# Patient Record
Sex: Female | Born: 1955 | Race: White | Hispanic: No | Marital: Single | Smoking: Former smoker
Health system: Southern US, Community
[De-identification: ages and names within clinical notes are randomized; demographics above are authoritative.]

## PROBLEM LIST (undated history)

## (undated) DIAGNOSIS — I10 Essential (primary) hypertension: Secondary | ICD-10-CM

## (undated) DIAGNOSIS — G709 Myoneural disorder, unspecified: Secondary | ICD-10-CM

## (undated) DIAGNOSIS — M1990 Unspecified osteoarthritis, unspecified site: Secondary | ICD-10-CM

## (undated) DIAGNOSIS — K219 Gastro-esophageal reflux disease without esophagitis: Secondary | ICD-10-CM

## (undated) HISTORY — DX: Unspecified osteoarthritis, unspecified site: M19.90

## (undated) HISTORY — PX: OOPHORECTOMY: SHX86

## (undated) HISTORY — PX: CHOLECYSTECTOMY: SHX55

## (undated) HISTORY — PX: KNEE ARTHROSCOPY: SUR90

## (undated) HISTORY — PX: TONSILLECTOMY AND ADENOIDECTOMY: SUR1326

## (undated) HISTORY — DX: Myoneural disorder, unspecified: G70.9

## (undated) HISTORY — DX: Gastro-esophageal reflux disease without esophagitis: K21.9

## (undated) HISTORY — DX: Essential (primary) hypertension: I10

## (undated) HISTORY — PX: ANKLE ARTHROSCOPY: SUR85

## (undated) HISTORY — PX: DILATION AND CURETTAGE OF UTERUS: SHX78

---

## 1998-08-25 ENCOUNTER — Emergency Department (HOSPITAL_COMMUNITY): Admission: EM | Admit: 1998-08-25 | Discharge: 1998-08-26 | Payer: Self-pay | Admitting: Emergency Medicine

## 1998-08-26 ENCOUNTER — Ambulatory Visit (HOSPITAL_COMMUNITY): Admission: RE | Admit: 1998-08-26 | Discharge: 1998-08-26 | Payer: Self-pay | Admitting: Emergency Medicine

## 1998-08-26 ENCOUNTER — Encounter: Payer: Self-pay | Admitting: Emergency Medicine

## 1998-10-19 ENCOUNTER — Encounter: Payer: Self-pay | Admitting: Gastroenterology

## 1998-10-19 ENCOUNTER — Ambulatory Visit (HOSPITAL_COMMUNITY): Admission: RE | Admit: 1998-10-19 | Discharge: 1998-10-19 | Payer: Self-pay | Admitting: Gastroenterology

## 1998-10-28 ENCOUNTER — Ambulatory Visit (HOSPITAL_COMMUNITY): Admission: RE | Admit: 1998-10-28 | Discharge: 1998-10-28 | Payer: Self-pay | Admitting: Gastroenterology

## 2000-03-03 ENCOUNTER — Other Ambulatory Visit: Admission: RE | Admit: 2000-03-03 | Discharge: 2000-03-03 | Payer: Self-pay | Admitting: Obstetrics and Gynecology

## 2000-03-27 ENCOUNTER — Ambulatory Visit (HOSPITAL_BASED_OUTPATIENT_CLINIC_OR_DEPARTMENT_OTHER): Admission: RE | Admit: 2000-03-27 | Discharge: 2000-03-27 | Payer: Self-pay | Admitting: Orthopedic Surgery

## 2001-02-13 ENCOUNTER — Other Ambulatory Visit: Admission: RE | Admit: 2001-02-13 | Discharge: 2001-02-13 | Payer: Self-pay | Admitting: Obstetrics and Gynecology

## 2002-02-20 ENCOUNTER — Ambulatory Visit (HOSPITAL_BASED_OUTPATIENT_CLINIC_OR_DEPARTMENT_OTHER): Admission: RE | Admit: 2002-02-20 | Discharge: 2002-02-20 | Payer: Self-pay | Admitting: Orthopedic Surgery

## 2002-12-10 ENCOUNTER — Other Ambulatory Visit: Admission: RE | Admit: 2002-12-10 | Discharge: 2002-12-10 | Payer: Self-pay | Admitting: Obstetrics and Gynecology

## 2003-02-27 ENCOUNTER — Encounter (INDEPENDENT_AMBULATORY_CARE_PROVIDER_SITE_OTHER): Payer: Self-pay | Admitting: Specialist

## 2003-02-27 ENCOUNTER — Ambulatory Visit (HOSPITAL_COMMUNITY): Admission: RE | Admit: 2003-02-27 | Discharge: 2003-02-27 | Payer: Self-pay | Admitting: Obstetrics and Gynecology

## 2003-10-02 ENCOUNTER — Emergency Department (HOSPITAL_COMMUNITY): Admission: EM | Admit: 2003-10-02 | Discharge: 2003-10-03 | Payer: Self-pay | Admitting: *Deleted

## 2003-10-28 ENCOUNTER — Encounter: Admission: RE | Admit: 2003-10-28 | Discharge: 2003-10-28 | Payer: Self-pay | Admitting: Gastroenterology

## 2003-12-08 ENCOUNTER — Ambulatory Visit (HOSPITAL_COMMUNITY): Admission: RE | Admit: 2003-12-08 | Discharge: 2003-12-08 | Payer: Self-pay | Admitting: Gastroenterology

## 2003-12-09 ENCOUNTER — Ambulatory Visit (HOSPITAL_COMMUNITY): Admission: RE | Admit: 2003-12-09 | Discharge: 2003-12-09 | Payer: Self-pay | Admitting: Surgery

## 2003-12-09 ENCOUNTER — Encounter (INDEPENDENT_AMBULATORY_CARE_PROVIDER_SITE_OTHER): Payer: Self-pay | Admitting: Specialist

## 2005-06-14 ENCOUNTER — Encounter: Admission: RE | Admit: 2005-06-14 | Discharge: 2005-09-12 | Payer: Self-pay | Admitting: Internal Medicine

## 2007-01-22 ENCOUNTER — Ambulatory Visit (HOSPITAL_COMMUNITY): Admission: RE | Admit: 2007-01-22 | Discharge: 2007-01-22 | Payer: Self-pay | Admitting: Gastroenterology

## 2007-12-30 ENCOUNTER — Encounter: Admission: RE | Admit: 2007-12-30 | Discharge: 2007-12-30 | Payer: Self-pay | Admitting: Obstetrics and Gynecology

## 2008-04-02 ENCOUNTER — Encounter (INDEPENDENT_AMBULATORY_CARE_PROVIDER_SITE_OTHER): Payer: Self-pay | Admitting: Obstetrics and Gynecology

## 2008-04-02 ENCOUNTER — Ambulatory Visit (HOSPITAL_COMMUNITY): Admission: RE | Admit: 2008-04-02 | Discharge: 2008-04-02 | Payer: Self-pay | Admitting: Obstetrics and Gynecology

## 2009-04-18 ENCOUNTER — Emergency Department (HOSPITAL_COMMUNITY): Admission: EM | Admit: 2009-04-18 | Discharge: 2009-04-18 | Payer: Self-pay | Admitting: Emergency Medicine

## 2010-05-31 ENCOUNTER — Encounter: Payer: Self-pay | Admitting: Obstetrics and Gynecology

## 2010-09-21 NOTE — Op Note (Signed)
NAME:  Susan Morrow, Susan Morrow              ACCOUNT NO.:  [id number]   MEDICAL RECORD NO.:  [id number]          PATIENT TYPE:  AMB   LOCATION:  SDC                           FACILITY:  [facility]   PHYSICIAN:  Sherry A. Dickstein, M.D.DATE OF BIRTH:  12-23-1955   DATE OF PROCEDURE:  04/02/2008  DATE OF DISCHARGE:                               OPERATIVE REPORT   PREOPERATIVE DIAGNOSIS:  Left adnexal mass.   POSTOPERATIVE DIAGNOSIS:  Left adnexal mass.   PROCEDURES:  Diagnostic laparoscopy and left salpingo-oophorectomy.   SURGEON:  Sherry A. Rosalio Macadamia, MD   ASSISTANT:  Lendon Colonel, MD   ANESTHESIA:  General.   INDICATIONS:  This is a 55 year old G0, P0 woman, who has had a left  adnexal mass diagnosed by ultrasound.  This has been followed  conservatively with repeat ultrasound; however, it has increased in  size.  The patient recently has noticed some increasing left lower  quadrant pain.  Because of the increased size on ultrasound, the patient  was brought to the operating room for diagnostic laparoscopy and  possible left salpingo-oophorectomy.   FINDINGS:  Normal-sized anteflexed uterus; normal right fallopian tube  and ovary; left adnexal mass consistent with fibroma; left ovary not  seen separate from the mass; left fallopian tube; and fimbria only  visualized, this involving the mass as well.   PROCEDURE IN DETAIL:  The patient was brought into the operating room.  She was given adequate general anesthesia.  She was placed in dorsal  lithotomy position.  Her abdomen and then her vagina were washed with  Betadine.  Bladder was catheterized with a Foley catheter.  A speculum  was placed within the vagina.  A single-tooth tenaculum was placed on  the cervix.  Cervix was attempted to be dilated.  However, it was  completely stenotic.  Small dilators were attempted to be passed in the  endocervical canal.  However, it was so stenotic even these dilators  could not be passed.   Therefore, a second single-tooth tenaculum was  placed on the cervix for manipulation.  Surgeon's gown and gloves were  changed, and the patient was draped in the sterile fashion.  Subumbilical incision was made after infiltrating with 0.25% Marcaine.  The incision was brought down to the fascia.  Fascia was grasped with  Kocher clamps.  Fascia was incised.  Fascial edges were identified and a  purse-string stitch was taken with 0 Vicryl.  The peritoneum was  identified and bluntly entered.  The Hasson trocar was introduced into  the peritoneal cavity and under direct visualization, the laparoscope  was introduced.  Carbon dioxide was insufflated.  The pelvic organs were  identified.  A left incision was made.  After infiltrating with 0.25%  Marcaine, 5-mm incision was made and a 5-mm trocar was introduced under  direct visualization.  Same procedure was performed on the right.  Pelvis was inspected, peritoneal washings were obtained with Ringer  lactate, and these were sent to Pathology.  The adnexal mass was  identified and felt to be incorporated into the fallopian tube and  ovary.  Adhesions of  the sigmoid colon were taken down off of the left  side wall to better see the adnexa.  The left ureter was identified well  below the IP ligament.  After long visualization, there is a small  amount of endometriosis present in the left side wall.  After the  adhesions were dissected and the bowel was cleared from the left tube  and ovary, the infundibulopelvic ligament was cauterized using Gyrus.  This was cauterized in several places and then cut and then along the  mesosalpinx, this was cauterized up to the utero-ovarian ligament.  The  specimen was then dissected free from the uterus in this fashion.  A 5-  mm scope was placed in one of the ports.  The main scope had been  removed and an Endocatch bag was placed.  The specimen was placed within  the Endocatch bag.  This was removed through  the subumbilical incision.  The main scope was then replaced.  The pelvis was inspected.  Irrigation  was used.  Adequate hemostasis was present after small amount of  cautery.  Pictures were obtained before and after surgery.  Marcaine 20  mL of 0.25% were left in the pelvis for postoperative pain relief.  All  carbon dioxide was allowed to escape.  The ports had been removed  laterally prior to the carbon dioxide escaping.  The subumbilical trocar  sleeve was removed.  The fascial stitch was closed over a finger to  preclude any bowel getting caught.  The incisions were all closed with 4-  0 Monocryl in subcuticular running stitch.  All incisions were also  closed with Dermabond.  Adequate hemostasis was present.  The tenaculums  were removed from the vagina.  The patient was taken out of the dorsal  lithotomy position.  She was awakened.  She was moved from the operating  table to a stretcher in stable condition.   COMPLICATIONS:  None.   ESTIMATED BLOOD LOSS:  Less than 10 mL.   SPECIMEN:  Left tube and ovary which was sent to Pathology and pelvic  washings also sent to pathology.      Sherry A. Rosalio Macadamia, M.D.  Electronically Signed     SAD/MEDQ  D:  04/02/2008  T:  04/02/2008  Job:  [id number]

## 2010-09-24 NOTE — Op Note (Signed)
NAME:  Susan Morrow, SMELCER                        ACCOUNT NO.:  [id number]   MEDICAL RECORD NO.:  [id number]                   PATIENT TYPE:  AMB   LOCATION:  SDC                                  [facility]ACILITY:  [facility]   PHYSICIAN:  Sherry A. Rosalio Macadamia, M.D.           DATE OF BIRTH:  Mar 29, 1956   DATE OF PROCEDURE:  02/27/2003  DATE OF DISCHARGE:                                 OPERATIVE REPORT   PREOPERATIVE DIAGNOSES:  1. Menorrhagia.  2. Thickened endometrium.  3. Hematometra.   POSTOPERATIVE DIAGNOSES:  1. Menorrhagia.  2. Thickened endometrium.  3. Hematometra.  4. Endocervical polyps.   PROCEDURE:  D&C hysteroscopy with resectoscope.   SURGEON:  Sherry A. Rosalio Macadamia, M.D.   ANESTHESIA:  MAC.   INDICATIONS FOR PROCEDURE:  This is a 54 year old G0, P0 woman who has been  having excessively heavy menstrual flow with her monthly periods.  The  patient had an ultrasound which revealed several large Nabothian cysts as  well as thickened endometrium and fluid in mid cavity of the endometrium.  Because of this, the patient is brought to the operating room for [facility]  hysteroscopy with resectoscope.   FINDINGS:  Normal size anteflexed uterus, no adnexal mass.  Cervical  stenosis.  Small hematometra, endocervical polyps.   DESCRIPTION OF PROCEDURE:  The patient is brought into the operating room  and given adequate IV sedation.  She is placed in dorsal lithotomy position.  Her perineum was washed with Hibiclens.  Pelvic examination was performed.  The patient was draped in sterile fashion.  Speculum was placed in the  vagina.  Vagina is washed with Hibiclens.  Paracervical block was  administered with 1% Nesacaine.  Anterior lip of the cervix is grasped with  a single-tooth tenaculum.  Cervical Nabothian cyst was drained.  Cervix was  sounded with some difficulty because of significant cervical stenosis.  After sounding the cervix, a small amount of old fluid drained from the  cervix.   The cervix was then dilated with Shawnie Pons dilators to a #31.  Hysteroscope was introduced into the endometrial cavity.  Pictures were  obtained.  An endocervical polyp was present.  This was excised with a  double loop resector.  Another polyp was seen at the endocervical lower  uterine segment junction and this was excised with the double loop resector.  Sheets of endometrial tissue were taken circumferentially which allowed good  viewing of the whole endometrial cavity.  Small bleeders were cauterized.  The endometrial tissue was cauterized circumferentially.  Adequate  hemostasis was present.  All instruments removed from the vagina.  The  patient was taken out of the dorsal lithotomy position.  She was awakened.  She was moved from the operating table to a stretcher in stable condition.   COMPLICATIONS:  None.   ESTIMATED BLOOD LOSS:  Less than 5 mL.   FLUIDS:  Sorbitol differential was -50 mL.   SPECIMENS:  One  endocervical polyp and two endometrial resections.                                               Sherry A. Rosalio Macadamia, M.D.    SAD/MEDQ  D:  02/27/2003  T:  02/27/2003  Job:  [id number]

## 2010-09-24 NOTE — Op Note (Signed)
[facility]  Patient:    Susan Morrow, Susan Morrow                       MRN: [id number] Proc. Date: 03/24/00 Adm. Date:  [id number] Disc. Date: [id number] Attending:  Alinda Deem                           Operative Report  PREOPERATIVE DIAGNOSIS:  Right ankle osteochondral defect.  POSTOPERATIVE DIAGNOSIS:  Right ankle osteochondral defect.  OPERATION PERFORMED:  Arthroscopic debridement of osteochondral defect of the talus of the right ankle.  SURGEON:  Alinda Deem, M.D.  ASSISTANT:  Skip Mayer, PA-C.  ANESTHESIA:  General endotracheal.  ESTIMATED BLOOD LOSS:  Minimal.  FLUID REPLACEMENT:  900 cc of crystalloid.  DRAINS:  None.  TOURNIQUET TIME:  None.  INDICATIONS FOR PROCEDURE:  The patient is a 55 year old woman with MRI proven osteochondral defect of the lateral side of the talar dome symptomatic with catching and pain and desires elective arthroscopic removal of same.  She has failed conservative treatment.  DESCRIPTION OF PROCEDURE:  The patient was identified by arm band and taken to the operating room at [facility] where the appropriate anesthetic monitors were attached and general endotracheal anesthesia induced with the patient in the supine position.  The right lower extremity was then prepped and draped in the usual sterile fashion from the toes to the midcalf where a tourniquet was placed but not used.  Traction was applied to the ankle and using the anterolateral portal, we injected 10 cc of 0.5% Marcaine with epinephrine solution into the ankle and then following this needle pathway, a standard portal was made with a #11 blade allowing introduction of the arthroscope through the anterolateral portal.  Diagnostic arthroscopy was then undertaken revealing normal anatomy except for an osteochondral flap tear of the lateral aspect of the talar dome just slightly anterior more than superior.  It was 3 to  4 mm in length and 2 to 3 mm in width.  This was then removed and a 2.9 mm great white sucker shaver from [employer] and the edges probed to make sure there was no stable margins.  We then took a small trephine and drilled holes into the subchondral bone to bring in a vascularity blood supply and some fibrocartilage.  At this point the ankle was then washed out with normal saline solution.  The arthroscopic instruments removed.  A dressing of Xeroform, 4 x 8 dressing sponges, Webril and an Ace wrap applied. The patient was awakened and taken to the recovery room without difficulty. DD:  04/10/00 TD:  04/10/00 Job: [id number]

## 2010-09-24 NOTE — Op Note (Signed)
NAME:  Susan Morrow, Susan Morrow                        ACCOUNT NO.:  [id number]   MEDICAL RECORD NO.:  [id number]                   PATIENT TYPE:  AMB   LOCATION:  ENDO                                 FACILITY:  [facility]   PHYSICIAN:  Abigail Miyamoto, M.D.              DATE OF BIRTH:  [birth year]   DATE OF PROCEDURE:  12/09/2003  DATE OF DISCHARGE:                                 OPERATIVE REPORT   PREOPERATIVE DIAGNOSIS:  Biliary dyskinesia.   POSTOPERATIVE DIAGNOSIS:  Biliary dyskinesia.   OPERATION PERFORMED:  Laparoscopic cholecystectomy with intraoperative  cholangiogram.   SURGEON:  Douglas A. Magnus Ivan, M.D.   ASSISTANT:  Rose Phi. Maple Hudson, M.D.   ANESTHESIA:  General endotracheal.   ESTIMATED BLOOD LOSS:  Minimal.   OPERATIVE FINDINGS:  The patient was found to have a normal cholangiogram.   DESCRIPTION OF PROCEDURE:  The patient was brought to the operating room and  identified at [employer].  She was placed supine on the operating table  and general anesthesia was induced.  Her abdomen was then prepped and draped  in the usual sterile fashion.  Using a #15 blade, a small vertical incision  was made below the umbilicus.  The incision was carried down to the fascia  which was then opened with a scalpel.  A hemostat was then used to pass into  the peritoneal cavity.  A 0 Vicryl pursestring suture was then placed around  the fascial opening.  The Hasson port was placed through the opening and  insufflation of the abdomen was begun.  A 12 mm port was then placed in the  patient's epigastrium and two 5 mm ports were placed in the patient's right  flank under direction vision.  The gallbladder was then grasped and  retracted above the liver bed.  The gallbladder appeared chronically thick-  walled.  The cystic duct and cystic artery were then easily dissected out.  A critical window was achieved around the cystic duct.  The artery was  clipped twice proximally and once distally.   The duct was then clipped once  distally and opened with laparoscopic scissors.  The Angiocatheter then  inserted into the right upper quadrant under direct vision.  The  cholangiocatheter was passed through this and placed into the opening in the  cystic duct.  The cholangiogram was then performed with contrast under  direct fluoroscopy.  Contrast was seen to flow into the entire biliary  system and duodenum without evidence of abnormality or obstruction.  The  cholangiocath was then removed completely.  The cystic duct was then clipped  three times proximally and transected.  The cystic artery was transected as  well.  The gallbladder was then slowly dissected free from the liver bed  with electrocautery.  Hemostasis was achieved in the liver bed with the  cautery.  The gallbladder was then removed through the incision at the  umbilicus.  The 0 Vicryl  was then tied in placed closing the fascial defect.  The abdomen was then again examined.  Hemostasis was felt to be achieved.  All ports were removed under direct vision and the abdomen was deflated.  All incisions were then anesthetized with 0.25% Marcaine and closed with 4-0  Vicryl subcuticular.  Steri-Strips, gauze and tape were then applied.  The  patient tolerated the procedure well.  All sponge, needle and instrument  counts were correct at the end of the procedure.  The patient was then  extubated in the operating room and taken in stable condition to the  recovery room.                                               Abigail Miyamoto, M.D.    DB/MEDQ  D:  12/09/2003  T:  12/09/2003  Job:  [id number]

## 2010-09-24 NOTE — Op Note (Signed)
NAME:  Susan Morrow, Susan Morrow                        ACCOUNT NO.:  [id number]   MEDICAL RECORD NO.:  [id number]                   PATIENT TYPE:  AMB   LOCATION:  ENDO                                 FACILITY:  [facility]   PHYSICIAN:  Anselmo Rod, M.D.               DATE OF BIRTH:  11-18-1955   DATE OF PROCEDURE:  12/08/2003  DATE OF DISCHARGE:                                 OPERATIVE REPORT   PROCEDURE:  Screening colonoscopy.   ENDOSCOPIST:  Anselmo Rod, M.D.   INSTRUMENT:  Olympus video colonoscope.   INDICATIONS FOR PROCEDURE:  A 55 year old white female with family history  of colon cancer undergoing a screening colonoscopy to rule out colonic  polyps, masses, etc.  The patient has had a change in bowel habits with  worsening diarrhea in the recent past.   PRE-PROCEDURE PREPARATION:  Informed consent was procured from the patient.  Patient fasted for 8 hours prior to the procedure and prepped with a bottle  of magnesium citrate and a gallon of GoLYTELY the night prior to the  procedure.  Pre-procedure physical:  Patient had stable vital signs, neck  supple, chest clear to auscultation, S1/S2 regular, abdomen soft with normal  bowel sounds.   DESCRIPTION OF PROCEDURE:  The patient was placed in the left lateral  decubitus position, sedated with 70 mg of Demerol and 7 mg of Versed  intravenously.  Once the patient was adequately sedated and maintained on  low flow oxygen, continuous cardiac monitoring; the Olympus video  colonoscope was advanced from the rectum to the cecum.  The appendiceal  orifice and the ileocecal valve were clearly visualized and photographed.  The terminal ileum appeared normal, no masses, polyps, erosions or  ulcerations were seen. Retroflexion in the rectum revealed no abnormalities.   IMPRESSION:  Normal colonoscopy to the cecum.   RECOMMENDATIONS:  1. Continue a high fiber diet with liberal fluids intake.  2. Repeat colonoscopy has been recommended  in the next 5 years unless the     patient develops any abnormal symptoms in the interim.  3. Outpatient follow up in the next 2 weeks or earlier if need be.                                               Anselmo Rod, M.D.    JNM/MEDQ  D:  12/08/2003  T:  12/08/2003  Job:  [id number]   cc:   Gabriel Earing, M.D.  [address]  [place]  [place] [address]  Fax: [phone]

## 2010-09-24 NOTE — Op Note (Signed)
NAME:  Susan Morrow, PALL                          ACCOUNT NO.:  [id number]   MEDICAL RECORD NO.:  [id number]                   PATIENT TYPE:  AMB   LOCATION:  DSC                                  FACILITY:  [facility]   PHYSICIAN:  Feliberto Gottron. Turner Daniels, M.D.                DATE OF BIRTH:  10/15/1955   DATE OF PROCEDURE:  02/20/2002  DATE OF DISCHARGE:                                 OPERATIVE REPORT   PREOPERATIVE DIAGNOSES:  1. Right knee chondromalacia of patella.  2. Medial meniscal tear.   POSTOPERATIVE DIAGNOSES:  1. Right chondromalacia of patella.  2. Medal meniscal tear.  3. Chondromalacia, lateral tibial condyle.   OPERATION PERFORMED:  1. Right knee partial medial meniscectomy.  2. Debridement of chondromalacia of patella, Grade III, focal apex, lateral     tibial condyle, Grade III, more regional around the rim of the meniscus.   SURGEON:  Feliberto Gottron. Turner Daniels, M.D.   FIRST ASSISTANT:  Erskine Squibb B. Su Hilt, P. A.   ANESTHESIA:  Local with IV sedation.   ESTIMATED BLOOD LOSS:  Minimal.   FLUIDS REPLACED:  Eight-hundred cc of crystalloid.   DRAINS PLACED:  None.   TOURNIQUET TIME:  None.   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  She has failed conservative treatment and now  desires arthroscopic evaluation and treatment of her right  knee.   DESCRIPTION OF OPERATION:  The patient was identified by armband and brought  to the operating room at [facility].  Appropriate anesthetic  monitors were attached and local anesthesia with IV sedation was induced  into the right lower extremity.  Lateral post applied to the table.  The  right lower extremity was prepped and draped in the usual sterile fashion  from the ankle to the midthigh.  Using a #11 blade standard inferomedial and  inferolateral parapatellar portals were made allowing  introduction of the  arthroscope through the inferolateral portal, and the outflow through the  inferomedial portal.  We immediately encountered multiple small  cartilaginous loose bodies in the joint fluid, which were taken through the  outflow continuously.  Apical chondromalacia of the patella was identified,  Grade III, and debrided back to stable margin with a 3.5 gator sucker  shaver.  The trochlea was in relatively good condition.  A small radial tear  at the medial meniscus was identified and debrided.  Grade II chondromalacia  of the medial tibial condyle was identified and widely debrided.  The  cruciate ligaments were intact.  On the lateral side regional  chondromalacia,  Grade III, to the lateral third of the lateral tibial  condyle was identified and debrided with flap tears; and, this was probably  the donor site for the chondral loose bodies.  The meniscus was intact  laterally.  The gutters were clear.  The scope was taken medial to the PCL  and a glimpse of the posterior horn of the meniscus was noted to be intact.   At this point the knee was washed out with normal saline solution.  The  arthroscopic instruments removed.  A dressing of Xeroform, four-by-four  dressings, sponges, Webril, and Ace wrap applied.   The patient was awakened and taken to the recovery room without difficulty.                                                 Feliberto Gottron. Turner Daniels, M.D.    Ovid Curd  D:  02/20/2002  T:  02/20/2002  Job:  [id number]

## 2011-02-08 LAB — COMPREHENSIVE METABOLIC PANEL
ALT: 33
AST: 23
Albumin: 3.7
Alkaline Phosphatase: 80
BUN: 18
GFR calc Af Amer: >60
Potassium: 3.7
Sodium: 140
Total Protein: 6.7

## 2011-02-08 LAB — DIFFERENTIAL
Basophils Relative: 1
Eosinophils Relative: 4
Monocytes Absolute: 0.8
Monocytes Relative: 9
Neutro Abs: 5.4

## 2011-02-08 LAB — CBC
Platelets: 242
RDW: 12.9
WBC: 8.9

## 2011-02-08 LAB — GLUCOSE, CAPILLARY

## 2011-06-02 ENCOUNTER — Encounter (INDEPENDENT_AMBULATORY_CARE_PROVIDER_SITE_OTHER): Payer: Self-pay | Admitting: Surgery

## 2011-06-08 ENCOUNTER — Encounter (INDEPENDENT_AMBULATORY_CARE_PROVIDER_SITE_OTHER): Payer: Self-pay | Admitting: Surgery

## 2011-06-13 ENCOUNTER — Encounter (INDEPENDENT_AMBULATORY_CARE_PROVIDER_SITE_OTHER): Payer: Self-pay | Admitting: Surgery

## 2011-07-04 ENCOUNTER — Ambulatory Visit (INDEPENDENT_AMBULATORY_CARE_PROVIDER_SITE_OTHER): Payer: BC Managed Care – PPO | Admitting: Surgery

## 2011-07-04 ENCOUNTER — Encounter (INDEPENDENT_AMBULATORY_CARE_PROVIDER_SITE_OTHER): Payer: Self-pay | Admitting: Surgery

## 2011-07-04 VITALS — BP 154/106 | HR 80 | Temp 97.4°F | Resp 18 | Ht 68.0 in | Wt 233.6 lb

## 2011-07-04 DIAGNOSIS — D1779 Benign lipomatous neoplasm of other sites: Secondary | ICD-10-CM

## 2011-07-04 DIAGNOSIS — D171 Benign lipomatous neoplasm of skin and subcutaneous tissue of trunk: Secondary | ICD-10-CM | POA: Insufficient documentation

## 2011-07-04 NOTE — Progress Notes (Signed)
Subjective:     Patient ID: Susan Morrow, female   DOB: 1956/01/08, 56 y.o.   MRN: [id number]  HPI This is a pleasant female referred by Georgette Shell P.A. for an evaluation of a lipoma on her back. She reports it has been there for many years. It is now slightly larger and is causing some intermittent discomfort. She is otherwise without complaints.  Review of Systems     Objective:   Physical Exam On exam, there is a 1 cm soft mass on the left mid lateral back. It is mobile and there are no skin changes    Assessment:     Lipoma of the back    Plan:     I discussed the benign nature of disease. I discussed surgical excision versus expectant management. She is going to think about it and in call back should she determine whether she will surgery or not

## 2011-07-14 ENCOUNTER — Ambulatory Visit
Admission: RE | Admit: 2011-07-14 | Discharge: 2011-07-14 | Disposition: A | Discharge status: Home / Self Care | Payer: BC Managed Care – PPO | Source: Ambulatory Visit | Attending: Otolaryngology | Admitting: Otolaryngology

## 2011-07-14 ENCOUNTER — Other Ambulatory Visit: Payer: Self-pay | Admitting: Otolaryngology

## 2011-07-14 DIAGNOSIS — IMO0001 Reserved for inherently not codable concepts without codable children: Secondary | ICD-10-CM

## 2011-07-14 DIAGNOSIS — R519 Headache, unspecified: Secondary | ICD-10-CM

## 2011-11-21 DIAGNOSIS — I10 Essential (primary) hypertension: Secondary | ICD-10-CM | POA: Insufficient documentation

## 2011-11-21 DIAGNOSIS — E119 Type 2 diabetes mellitus without complications: Secondary | ICD-10-CM | POA: Insufficient documentation

## 2011-11-25 ENCOUNTER — Encounter (INDEPENDENT_AMBULATORY_CARE_PROVIDER_SITE_OTHER): Payer: Self-pay

## 2012-03-23 ENCOUNTER — Emergency Department (INDEPENDENT_AMBULATORY_CARE_PROVIDER_SITE_OTHER): Payer: Worker's Compensation

## 2012-03-23 ENCOUNTER — Emergency Department (INDEPENDENT_AMBULATORY_CARE_PROVIDER_SITE_OTHER)
Admission: EM | Admit: 2012-03-23 | Discharge: 2012-03-23 | Disposition: A | Discharge status: Home / Self Care | Payer: Worker's Compensation | Source: Home / Self Care | Attending: Emergency Medicine | Admitting: Emergency Medicine

## 2012-03-23 ENCOUNTER — Encounter (HOSPITAL_COMMUNITY): Payer: Self-pay | Admitting: Emergency Medicine

## 2012-03-23 DIAGNOSIS — S6390XA Sprain of unspecified part of unspecified wrist and hand, initial encounter: Secondary | ICD-10-CM

## 2012-03-23 DIAGNOSIS — S139XXA Sprain of joints and ligaments of unspecified parts of neck, initial encounter: Secondary | ICD-10-CM

## 2012-03-23 DIAGNOSIS — S39012A Strain of muscle, fascia and tendon of lower back, initial encounter: Secondary | ICD-10-CM

## 2012-03-23 DIAGNOSIS — S335XXA Sprain of ligaments of lumbar spine, initial encounter: Secondary | ICD-10-CM

## 2012-03-23 DIAGNOSIS — S161XXA Strain of muscle, fascia and tendon at neck level, initial encounter: Secondary | ICD-10-CM

## 2012-03-23 DIAGNOSIS — S63619A Unspecified sprain of unspecified finger, initial encounter: Secondary | ICD-10-CM

## 2012-03-23 MED ORDER — CYCLOBENZAPRINE HCL 10 MG PO TABS: ORAL_TABLET | ORAL | Status: DC

## 2012-03-23 MED ORDER — IBUPROFEN 800 MG PO TABS
ORAL_TABLET | ORAL | Status: AC
  Filled 2012-03-23: qty 1

## 2012-03-23 MED ORDER — IBUPROFEN 800 MG PO TABS
800.0000 mg | ORAL_TABLET | Freq: Once | ORAL | Status: AC
  Administered 2012-03-23: 800 mg via ORAL
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
## 2012-03-23 NOTE — ED Provider Notes (Signed)
Medical screening examination/treatment/procedure(s) were performed by non-physician practitioner and as supervising physician I was immediately available for consultation/collaboration.  Leslee Home, M.D.   Reuben Likes, MD 03/23/12 2125
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
## 2012-05-10 ENCOUNTER — Other Ambulatory Visit: Payer: Self-pay

## 2013-07-06 ENCOUNTER — Encounter (HOSPITAL_COMMUNITY): Payer: Self-pay | Admitting: Emergency Medicine

## 2013-07-06 ENCOUNTER — Emergency Department (INDEPENDENT_AMBULATORY_CARE_PROVIDER_SITE_OTHER)
Admission: EM | Admit: 2013-07-06 | Discharge: 2013-07-06 | Disposition: A | Discharge status: Home / Self Care | Payer: Worker's Compensation | Source: Home / Self Care | Attending: Emergency Medicine | Admitting: Emergency Medicine

## 2013-07-06 ENCOUNTER — Emergency Department (INDEPENDENT_AMBULATORY_CARE_PROVIDER_SITE_OTHER): Payer: Worker's Compensation

## 2013-07-06 DIAGNOSIS — T148XXA Other injury of unspecified body region, initial encounter: Secondary | ICD-10-CM

## 2013-07-06 NOTE — ED Provider Notes (Signed)
Medical screening examination/treatment/procedure(s) were performed by a resident physician and as supervising physician I was immediately available for consultation/collaboration.  Philipp Deputy, M.D.  Harden Mo, MD 07/06/13 2026

## 2013-07-06 NOTE — Discharge Instructions (Signed)
You likely bruised one of you knee bones when you fell.  There is no sign of a fracture.  Keep it elevated as much as possible. Apply ice for 20 minutes 3 times a day. Continue taking meloxicam. Use Voltaren gel as needed. You can take tylenol arthritis if needed.  Follow up with your primary doctor or orthopedist if it is not improving in the next 2 weeks.

## 2013-07-06 NOTE — ED Provider Notes (Signed)
CSN: [id number]     Arrival date & time 07/06/13  1504 History   First MD Initiated Contact with Patient 07/06/13 1629     Chief Complaint  Patient presents with  . Knee Pain   (Consider location/radiation/quality/duration/timing/severity/associated sxs/prior Treatment) HPI Patient reports falling on her left knee on Wednesday.  Reports tripping over an electrical cord at her work.  She landed with most of her weight on her bent left knee (carpet over concrete).  States she had immediate pain and swelling of the knee.  She was able to bear weight within a few minutes but with pain.  Since the fall, she reports getting a little better.  Still reports swelling in the knee, although it is improved.  At rest she doesn't have much pain, but she is very sore over the insertion of the patellar tendon.  Pain is worse when she walks, especially up and down stairs.  Denies any twisting component of the injury.  Does have some popping in the knee, but no locking.  Denies worsening pain with rotational movements.  She is taking daily meloxicam for other MSK injuries.  She has also been using some voltaren gel on the knee, which seems to help some.  Past Medical History  Diagnosis Date  . Diabetes mellitus   . Arthritis   . Asthma   . GERD (gastroesophageal reflux disease)   . Hypertension   . Neuromuscular disorder     neuropathy, and tremor   Past Surgical History  Procedure Laterality Date  . Cholecystectomy    . Dilation and curettage of uterus    . Oophorectomy      left  . Tonsillectomy and adenoidectomy    . Knee arthroscopy    . Ankle arthroscopy      right   Family History  Problem Relation Age of Onset  . Cancer Mother     kidney, brain  . Cancer Father     lung, pancreatic   History  Substance Use Topics  . Smoking status: Former Research scientist (life sciences)  . Smokeless tobacco: Not on file  . Alcohol Use: Yes     Comment: occ.   OB History   Grav Para Term Preterm Abortions TAB SAB Ect Mult  Living                 Review of Systems  Constitutional: Negative.   Respiratory: Negative.   Cardiovascular: Negative.   Gastrointestinal: Negative.   Genitourinary: Negative.   Musculoskeletal: Positive for arthralgias (left knee pain with swelling).  Neurological: Negative.     Allergies  Spironolactone; Promethazine hcl; Lortab; Phenergan; Shrimp; Vigamox; and Hydrocodone-acetaminophen  Home Medications   Current Outpatient Rx  Name  Route  Sig  Dispense  Refill  . cetirizine (ZYRTEC) 10 MG tablet   Oral   Take 10 mg by mouth daily.         . cyclobenzaprine (FLEXERIL) 10 MG tablet      1/2 to one tab po TID   20 tablet   0   . fluticasone (FLONASE) 50 MCG/ACT nasal spray      Ad lib.         Marland Kitchen glimepiride (AMARYL) 4 MG tablet      BID times 48H.         . hydrochlorothiazide (HYDRODIURIL) 25 MG tablet   Oral   Take 25 mg by mouth as needed.         Marland Kitchen JANUVIA 100 MG tablet  daily.         Marland Kitchen LANTUS SOLOSTAR 100 UNIT/ML injection      35 Units daily.         . meloxicam (MOBIC) 7.5 MG tablet   Oral   Take 7.5 mg by mouth daily.         Marland Kitchen NEXIUM 40 MG capsule      daily.         . quinapril (ACCUPRIL) 5 MG tablet      daily.         . sertraline (ZOLOFT) 100 MG tablet      daily.         . VENTOLIN HFA 108 (90 BASE) MCG/ACT inhaler      Ad lib.         Marland Kitchen aspirin 81 MG tablet   Oral   Take 81 mg by mouth daily.          BP 164/89  Pulse 83  Temp(Src) 98.1 F (36.7 C) (Oral)  Resp 18  SpO2 95% Physical Exam  Constitutional: She appears well-developed and well-nourished. No distress.  HENT:  Head: Normocephalic and atraumatic.  Neck: Normal range of motion.  Cardiovascular: Normal rate, regular rhythm and normal heart sounds.   No murmur heard. Pulmonary/Chest: Effort normal and breath sounds normal. No respiratory distress. She has no wheezes. She has no rales.  Musculoskeletal:       Left knee: She  exhibits decreased range of motion, swelling (inferior lateral knee) and ecchymosis (over patellar insertion). She exhibits no deformity, no laceration, no erythema, no LCL laxity, normal patellar mobility, normal meniscus and no MCL laxity. Tenderness (patellar insertion) found. Patellar tendon tenderness noted. No medial joint line, no lateral joint line, no MCL and no LCL tenderness noted.  Left knee with soft tissue swelling over inferior lateral aspect.  Small joint effusion.  Bruising over the patellar insertion.  Tender over distal patellar tendon and patellar insertion.  No joint instability. Negative McMurrays.   Skin: Skin is warm and dry. No rash noted. She is not diaphoretic.  Psychiatric: Judgment and thought content normal.    ED Course  Procedures (including critical care time) Labs Review Labs Reviewed - No data to display Imaging Review Dg Knee Complete 4 Views Left  07/06/2013   CLINICAL DATA:  [place] onto left knee 4 days ago. Persistent anterior pain.  EXAM: LEFT KNEE - COMPLETE 4+ VIEW  COMPARISON:  None.  FINDINGS: No fracture or dislocation.  There are mild degenerative changes with small marginal osteophytes from all 3 compartments.  No joint effusion. There is mild anterior soft tissue edema. Soft tissues are otherwise unremarkable.  IMPRESSION: No fracture or acute finding.  Mild osteoarthritis.   Electronically Signed   By: Lajean Manes M.D.   On: 07/06/2013 17:10     MDM   1. Bone bruise    May also have partial tear of patellar tendon, but less likely. Discussed conservative management with rest, elevation, ice, and meloxicam. Patient declined pain medication at this time - can take tylenol with the meloxicam if needed. Follow up with orthopedist if not improving over next 2 weeks.    Melony Overly, MD 07/06/13 [phone]

## 2013-07-06 NOTE — ED Notes (Signed)
C/o L knee pain after falling on carpet on Wednesday.  C/o pain R wrist.  Has swelling in her knee.

## 2013-09-02 DIAGNOSIS — E559 Vitamin D deficiency, unspecified: Secondary | ICD-10-CM | POA: Insufficient documentation

## 2013-09-19 ENCOUNTER — Encounter (HOSPITAL_COMMUNITY): Payer: Self-pay | Admitting: Emergency Medicine

## 2013-09-19 ENCOUNTER — Emergency Department (HOSPITAL_COMMUNITY): Payer: BC Managed Care – PPO

## 2013-09-19 ENCOUNTER — Emergency Department (HOSPITAL_COMMUNITY)
Admission: EM | Admit: 2013-09-19 | Discharge: 2013-09-19 | Disposition: A | Discharge status: Home / Self Care | Payer: BC Managed Care – PPO | Attending: Emergency Medicine | Admitting: Emergency Medicine

## 2013-09-19 DIAGNOSIS — R002 Palpitations: Secondary | ICD-10-CM

## 2013-09-19 DIAGNOSIS — E669 Obesity, unspecified: Secondary | ICD-10-CM

## 2013-09-19 DIAGNOSIS — Z8669 Personal history of other diseases of the nervous system and sense organs: Secondary | ICD-10-CM | POA: Insufficient documentation

## 2013-09-19 DIAGNOSIS — K219 Gastro-esophageal reflux disease without esophagitis: Secondary | ICD-10-CM | POA: Insufficient documentation

## 2013-09-19 DIAGNOSIS — M129 Arthropathy, unspecified: Secondary | ICD-10-CM | POA: Insufficient documentation

## 2013-09-19 DIAGNOSIS — IMO0001 Reserved for inherently not codable concepts without codable children: Secondary | ICD-10-CM

## 2013-09-19 DIAGNOSIS — J45901 Unspecified asthma with (acute) exacerbation: Secondary | ICD-10-CM | POA: Insufficient documentation

## 2013-09-19 DIAGNOSIS — E1165 Type 2 diabetes mellitus with hyperglycemia: Secondary | ICD-10-CM

## 2013-09-19 DIAGNOSIS — R072 Precordial pain: Secondary | ICD-10-CM | POA: Insufficient documentation

## 2013-09-19 DIAGNOSIS — Z79899 Other long term (current) drug therapy: Secondary | ICD-10-CM | POA: Insufficient documentation

## 2013-09-19 DIAGNOSIS — R079 Chest pain, unspecified: Secondary | ICD-10-CM

## 2013-09-19 DIAGNOSIS — E119 Type 2 diabetes mellitus without complications: Secondary | ICD-10-CM | POA: Insufficient documentation

## 2013-09-19 DIAGNOSIS — F419 Anxiety disorder, unspecified: Secondary | ICD-10-CM

## 2013-09-19 DIAGNOSIS — Z87891 Personal history of nicotine dependence: Secondary | ICD-10-CM | POA: Insufficient documentation

## 2013-09-19 DIAGNOSIS — Z791 Long term (current) use of non-steroidal anti-inflammatories (NSAID): Secondary | ICD-10-CM | POA: Insufficient documentation

## 2013-09-19 DIAGNOSIS — F411 Generalized anxiety disorder: Secondary | ICD-10-CM

## 2013-09-19 DIAGNOSIS — IMO0002 Reserved for concepts with insufficient information to code with codable children: Secondary | ICD-10-CM

## 2013-09-19 DIAGNOSIS — I10 Essential (primary) hypertension: Secondary | ICD-10-CM | POA: Insufficient documentation

## 2013-09-19 LAB — CBC WITH DIFFERENTIAL/PLATELET
Basophils Absolute: 0 K/uL (ref 0.0–0.1)
Basophils Relative: 1 % (ref 0–1)
Eosinophils Absolute: 0.3 K/uL (ref 0.0–0.7)
Eosinophils Relative: 4 % (ref 0–5)
HCT: 42.1 % (ref 36.0–46.0)
Hemoglobin: 14.9 g/dL (ref 12.0–15.0)
Lymphocytes Relative: 26 % (ref 12–46)
Lymphs Abs: 2.3 K/uL (ref 0.7–4.0)
MCH: 29.9 pg (ref 26.0–34.0)
MCHC: 35.4 g/dL (ref 30.0–36.0)
MCV: 84.5 fL (ref 78.0–100.0)
Monocytes Absolute: 0.5 K/uL (ref 0.1–1.0)
Monocytes Relative: 6 % (ref 3–12)
Neutro Abs: 5.6 K/uL (ref 1.7–7.7)
Neutrophils Relative %: 63 % (ref 43–77)
Platelets: 191 K/uL (ref 150–400)
RBC: 4.98 MIL/uL (ref 3.87–5.11)
RDW: 12.9 % (ref 11.5–15.5)
WBC: 8.8 K/uL (ref 4.0–10.5)

## 2013-09-19 LAB — PRO B NATRIURETIC PEPTIDE: PRO B NATRI PEPTIDE: 56.3 pg/mL (ref 0–125)

## 2013-09-19 LAB — PROTIME-INR
INR: 0.97 (ref 0.00–1.49)
Prothrombin Time: 12.7 seconds (ref 11.6–15.2)

## 2013-09-19 LAB — COMPREHENSIVE METABOLIC PANEL
ALBUMIN: 4 g/dL (ref 3.5–5.2)
ALK PHOS: 109 U/L (ref 39–117)
ALT: 24 U/L (ref 0–35)
AST: 28 U/L (ref 0–37)
BILIRUBIN TOTAL: 0.5 mg/dL (ref 0.3–1.2)
BUN: 15 mg/dL (ref 6–23)
CHLORIDE: 101 meq/L (ref 96–112)
CO2: 25 mEq/L (ref 19–32)
Calcium: 9 mg/dL (ref 8.4–10.5)
Creatinine, Ser: 0.73 mg/dL (ref 0.50–1.10)
GFR calc Af Amer: >90 mL/min (ref >90)
GFR calc non Af Amer: >90 mL/min (ref >90)
Glucose, Bld: 160 mg/dL — ABNORMAL HIGH (ref 70–99)
Potassium: 3.7 mEq/L (ref 3.7–5.3)
SODIUM: 140 meq/L (ref 137–147)
TOTAL PROTEIN: 7.3 g/dL (ref 6.0–8.3)

## 2013-09-19 LAB — TROPONIN I
Troponin I: <0.3 ng/mL (ref <0.30)
Troponin I: <0.3 ng/mL

## 2013-09-19 MED ORDER — ASPIRIN 325 MG PO TABS: 325.0000 mg | ORAL_TABLET | ORAL | Status: DC

## 2013-09-19 MED ORDER — NITROGLYCERIN 0.3 MG SL SUBL: 0.3000 mg | SUBLINGUAL_TABLET | SUBLINGUAL | Status: DC | PRN

## 2013-09-19 MED ORDER — NITROGLYCERIN 0.4 MG SL SUBL: 0.4000 mg | SUBLINGUAL_TABLET | SUBLINGUAL | Status: DC | PRN

## 2013-09-19 NOTE — ED Notes (Addendum)
Pt to ED via [facility] from PCP for further evaluation of chest pressure at night X 3 nights- admits to shortness of breath associated with symptoms. Pt was put on Norvasc for HTN 1 month ago- pt went for follow up appointment today, BP has been controlled but provider is concerned about chest pressure.  Pt also reports some dizziness when sitting up or standing.  Pt denies pain at time, NSR on monitor, IV in place.

## 2013-09-19 NOTE — ED Notes (Signed)
Phlebotomy at bedside.

## 2013-09-19 NOTE — ED Notes (Signed)
Pt ambulatory to restroom without difficulty.

## 2013-09-19 NOTE — ED Notes (Signed)
Cardiologist at the bedside

## 2013-09-19 NOTE — Consult Note (Signed)
Admit date: 09/19/2013 Referring Physician  Dr. 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 She states that 2 nights ago she began having some palpitations, intermittent while laying in bed but seem to be worse when laying on her stomach. The next night she began having occasional shortness of breath as well and may have had some left-sided upper chest discomfort that she says is barely on the scale.  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 She is currently on amlodipine and hydrochlorothiazide as well as quinapril. She sees Dr. Suzette Battiest.   Here in the emergency department, troponins have been normal.  EKG shows sinus rhythm, left axis deviation with nonspecific ST-T wave changes  Pro BNP is 56, normal, hemoglobin 14.9, white count 8.8, potassium 3.7, creatinine 0.7, liver functions are normal.  Chest x-ray, personally viewed shows no evidence of cardiopulmonary disease.  She is currently chest pain-free. Her blood pressures have been ranging from 258-527 systolic in the emergency room. No adverse arrhythmias on telemetry.    PMH:   Past Medical History  Diagnosis Date  . Diabetes mellitus   . Arthritis   . Asthma   . GERD (gastroesophageal reflux disease)   . Hypertension   . Neuromuscular disorder     neuropathy, and tremor    PSH:   Past Surgical History  Procedure Laterality Date  . Cholecystectomy    . Dilation and curettage of uterus    . Oophorectomy      left  .  Tonsillectomy and adenoidectomy    . Knee arthroscopy    . Ankle arthroscopy      right   Allergies:  Shrimp; Spironolactone; Promethazine hcl; Lortab; Vigamox; and Hydrocodone-acetaminophen Prior to Admit Meds:   Prior to Admission medications   Medication Sig Start Date End Date Taking? Authorizing Provider  albuterol (PROVENTIL HFA;VENTOLIN HFA) 108 (90 BASE) MCG/ACT inhaler Inhale 2 puffs into the lungs every 6 (six) hours as needed for wheezing or shortness of breath.   Yes Historical Provider, MD  ALPRAZolam Duanne Moron) 0.5 MG tablet Take 0.5 mg by mouth at bedtime as needed for anxiety or sleep.   Yes Historical Provider, MD  amLODipine (NORVASC) 2.5 MG tablet Take 2.5 mg by mouth daily.   Yes Historical Provider, MD  cetirizine (ZYRTEC) 10 MG tablet Take 10 mg by mouth daily as needed for allergies.    Yes Historical Provider, MD  Chromium-Cinnamon (CINNAMON PLUS CHROMIUM PO) Take 1 capsule by mouth 2 (two) times daily.   Yes Historical Provider, MD  clobetasol cream (TEMOVATE) 7.82 % Apply 1 application topically 2 (two) times daily as needed (for sclerosis).   Yes Historical Provider, MD  cyclobenzaprine (FLEXERIL) 10 MG tablet Take 10 mg by mouth 3 (three) times daily as needed for muscle spasms.   Yes Historical Provider, MD  EPINEPHrine (EPIPEN) 0.3 mg/0.3 mL IJ SOAJ injection Inject 0.3 mg into the muscle once as needed (for allergic reaction).   Yes Historical Provider, MD  esomeprazole (NEXIUM) 40 MG capsule Take 40 mg by  mouth daily at 12 noon.   Yes Historical Provider, MD  glimepiride (AMARYL) 4 MG tablet Take 4 mg by mouth 2 (two) times daily.   Yes Historical Provider, MD  hydrochlorothiazide (HYDRODIURIL) 25 MG tablet Take 25 mg by mouth daily.    Yes Historical Provider, MD  insulin glargine (LANTUS) 100 UNIT/ML injection Inject 70 Units into the skin at bedtime.   Yes Historical Provider, MD  meloxicam (MOBIC) 7.5 MG tablet Take 7.5 mg by mouth daily.   Yes Historical  Provider, MD  oxyCODONE-acetaminophen (PERCOCET/ROXICET) 5-325 MG per tablet Take 1 tablet by mouth every 6 (six) hours as needed for severe pain.   Yes Historical Provider, MD  quinapril (ACCUPRIL) 10 MG tablet Take 10 mg by mouth at bedtime.   Yes Historical Provider, MD  sertraline (ZOLOFT) 100 MG tablet Take 200 mg by mouth at bedtime.  06/11/11  Yes Historical Provider, MD  sitaGLIPtin (JANUVIA) 100 MG tablet Take 100 mg by mouth daily.   Yes Historical Provider, MD  valACYclovir (VALTREX) 1000 MG tablet Take 1,000 mg by mouth 2 (two) times daily as needed (for outbreak).   Yes Historical Provider, MD  Vitamin D, Ergocalciferol, (DRISDOL) 50000 UNITS CAPS capsule Take 50,000 Units by mouth every 7 (seven) days.   Yes Historical Provider, MD   Fam HX:    Family History  Problem Relation Age of Onset  . Cancer Mother     kidney, brain  . Cancer Father     lung, pancreatic   no early family history of CAD. Social HX:    History   Social History  . Marital Status: Single    Spouse Name: N/A    Number of Children: N/A  . Years of Education: N/A   Occupational History  . Not on file.   Social History Main Topics  . Smoking status: Former Research scientist (life sciences)  . Smokeless tobacco: Not on file  . Alcohol Use: Yes     Comment: occ.  . Drug Use: No  . Sexual Activity: Yes    Birth Control/ Protection: Surgical   Other Topics Concern  . Not on file   Social History Narrative  . 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  ROS:  All 11 ROS were addressed and are negative except what is stated in the HPI. Denies any strokelike symptoms, no rash, no fevers, no chills, no cough, no orthopnea, no syncope, no bleeding. Positive for chronic arthritic pain. She has been on meloxicam for 2 years.   Physical Exam: Blood pressure 150/64, pulse 81, temperature 98.1 F (36.7 C), temperature source Oral, resp. rate 18, height 5\' 7"  (1.702 m), weight 237 lb (107.502 kg), SpO2 98.00%.   General:  Well developed, well nourished, in no acute distress Head: Eyes PERRLA, No xanthomas.   Normal cephalic and atramatic  Lungs:   Clear bilaterally to auscultation and percussion. Normal respiratory effort. No wheezes, no rales. Heart:   HRRR S1 S2 Pulses are 2+ & equal. No murmur, rubs, gallops.  No carotid bruit. No JVD.  No abdominal bruits.  Abdomen: Bowel sounds are positive, abdomen soft and non-tender without masses. No hepatosplenomegaly. Overweight Msk:  Back normal. Normal strength and tone for age. Extremities:  No clubbing, cyanosis or edema.  DP +1 Neuro: Alert and oriented X 3, non-focal, MAE x 4 GU: Deferred Rectal: Deferred Psych:  Good affect, responds appropriately      Labs: Lab Results  Component Value Date   WBC  8.8 09/19/2013   HGB 14.9 09/19/2013   HCT 42.1 09/19/2013   MCV 84.5 09/19/2013   PLT 191 09/19/2013     Recent Labs Lab 09/19/13 1210  NA 140  K 3.7  CL 101  CO2 25  BUN 15  CREATININE 0.73  CALCIUM 9.0  PROT 7.3  BILITOT 0.5  ALKPHOS 109  ALT 24  AST 28  GLUCOSE 160*    Recent Labs  09/19/13 1210 09/19/13 1400  TROPONINI <0.30 <0.30      Radiology:  Dg Chest 2 View  09/19/2013   CLINICAL DATA:  Hypertension and left upper chest pain  EXAM: CHEST  2 VIEW  COMPARISON:  DG CHEST 2 VIEW dated 10/02/2003  FINDINGS: The lungs are adequately inflated. There is no focal infiltrate. The cardiopericardial silhouette is normal in size. The pulmonary vascularity is not engorged. The mediastinum is normal in width. There is no pleural effusion. The observed portions of the bony thorax appear normal.  IMPRESSION: There is no evidence of active cardiopulmonary disease.   Electronically Signed   By: David  Martinique   On: 09/19/2013 13:42   Personally viewed.  EKG:  As described above, no ischemic changes Personally viewed.   ASSESSMENT/PLAN:   58 year old female with diabetes, hypertension, stress/anxiety, atypical chest pain.  1. Atypical chest  pain-likely musculoskeletal in etiology however given her diabetes which is a coronary artery disease equivalent I will advocate for her to obtain an outpatient nuclear stress test. She had a stress test several years ago and was told it was normal. I will go ahead and have this set up through the office tomorrow. If symptoms worsen or become more worrisome, she knows to seek medical attention. I asked her to continue with aspirin 81 mg.  2. Diabetes-she is working with her endocrinologist Dr. Suzette Battiest. I requested that she be placed on statin therapy. She will discuss this further with Joni Reining.  3. Hypertension-fairly well controlled currently. They have been trying new medications. She has been having some side effects with different types of medications. She felt menopausal-like symptoms at one point. Currently fairly well controlled.  4. Obesity-encourage weight loss.  5. Palpitations-may be PVCs or PACs. No adverse arrhythmias noted on telemetry. No high-risk symptoms such as syncope. Continue to monitor clinically.  I'm comfortable with her being discharged from emergency department. We will have her followup with outpatient stress test. I have given her my card and she knows to contact us if any further worrisome symptoms develop.   Candee Furbish, MD  09/19/2013  7:50 PM

## 2013-09-19 NOTE — ED Notes (Signed)
Discussed plan of care with Dr. Stevie Kern.  Patient has been seen by cardiology. Will plan for discharge.

## 2013-09-19 NOTE — ED Notes (Signed)
Diet tray ordered 

## 2013-09-19 NOTE — ED Notes (Signed)
Pt reports she is hungry- given sandwich- hx of diabetes.

## 2013-09-19 NOTE — ED Notes (Signed)
Dr Harrison at bedside

## 2013-09-19 NOTE — ED Provider Notes (Signed)
CSN: [id number]     Arrival date & time 09/19/13  1147 History   First MD Initiated Contact with Patient 09/19/13 1153     Chief Complaint  Patient presents with  . Chest Pain     (Consider location/radiation/quality/duration/timing/severity/associated sxs/prior Treatment) Patient is a 58 y.o. female presenting with chest pain. The history is provided by the patient.  Chest Pain Pain location:  Substernal area Pain quality: tightness   Pain radiates to:  Does not radiate Pain radiates to the back: no   Pain severity:  Mild Onset quality:  Gradual Duration:  2 days Timing:  Constant Progression:  Waxing and waning Chronicity:  New Context: at rest   Relieved by:  Nothing Exacerbated by: supine position. Ineffective treatments:  None tried Associated symptoms: palpitations and shortness of breath   Associated symptoms: no abdominal pain, no back pain, no cough, no dizziness, no fatigue, no fever, no headache, no nausea and not vomiting     Past Medical History  Diagnosis Date  . Diabetes mellitus   . Arthritis   . Asthma   . GERD (gastroesophageal reflux disease)   . Hypertension   . Neuromuscular disorder     neuropathy, and tremor   Past Surgical History  Procedure Laterality Date  . Cholecystectomy    . Dilation and curettage of uterus    . Oophorectomy      left  . Tonsillectomy and adenoidectomy    . Knee arthroscopy    . Ankle arthroscopy      right   Family History  Problem Relation Age of Onset  . Cancer Mother     kidney, brain  . Cancer Father     lung, pancreatic   History  Substance Use Topics  . Smoking status: Former Research scientist (life sciences)  . Smokeless tobacco: Not on file  . Alcohol Use: Yes     Comment: occ.   OB History   Grav Para Term Preterm Abortions TAB SAB Ect Mult Living                 Review of Systems  Constitutional: Negative for fever and fatigue.  HENT: Negative for congestion and drooling.   Eyes: Negative for pain.  Respiratory:  Positive for chest tightness and shortness of breath. Negative for cough.   Cardiovascular: Positive for palpitations. Negative for chest pain.  Gastrointestinal: Negative for nausea, vomiting, abdominal pain and diarrhea.  Genitourinary: Negative for dysuria and hematuria.  Musculoskeletal: Negative for back pain, gait problem and neck pain.  Skin: Negative for color change.  Neurological: Negative for dizziness and headaches.  Hematological: Negative for adenopathy.  Psychiatric/Behavioral: Negative for behavioral problems.  All other systems reviewed and are negative.     Allergies  Shrimp; Spironolactone; Promethazine hcl; Lortab; Vigamox; and Hydrocodone-acetaminophen  Home Medications   Prior to Admission medications   Medication Sig Start Date End Date Taking? Authorizing Provider  albuterol (PROVENTIL HFA;VENTOLIN HFA) 108 (90 BASE) MCG/ACT inhaler Inhale 2 puffs into the lungs every 6 (six) hours as needed for wheezing or shortness of breath.   Yes Historical Provider, MD  ALPRAZolam Duanne Moron) 0.5 MG tablet Take 0.5 mg by mouth at bedtime as needed for anxiety or sleep.   Yes Historical Provider, MD  amLODipine (NORVASC) 2.5 MG tablet Take 2.5 mg by mouth daily.   Yes Historical Provider, MD  cetirizine (ZYRTEC) 10 MG tablet Take 10 mg by mouth daily.   Yes Historical Provider, MD  cyclobenzaprine (FLEXERIL) 10 MG tablet Take  10 mg by mouth 3 (three) times daily as needed for muscle spasms.   Yes Historical Provider, MD  EPINEPHrine (EPIPEN) 0.3 mg/0.3 mL IJ SOAJ injection Inject 0.3 mg into the muscle once as needed (for allergic reaction).   Yes Historical Provider, MD  glimepiride (AMARYL) 4 MG tablet Take 4 mg by mouth 2 (two) times daily.   Yes Historical Provider, MD  hydrochlorothiazide (HYDRODIURIL) 25 MG tablet Take 25 mg by mouth as needed.    Historical Provider, MD  meloxicam (MOBIC) 7.5 MG tablet Take 7.5 mg by mouth daily.    Historical Provider, MD  NEXIUM 40 MG  capsule daily. 05/19/11   Historical Provider, MD  quinapril (ACCUPRIL) 5 MG tablet daily. 04/21/11   Historical Provider, MD  sertraline (ZOLOFT) 100 MG tablet daily. 06/11/11   Historical Provider, MD   BP 155/68  Temp(Src) 98.1 F (36.7 C) (Oral)  Resp 16  Ht 5\' 7"  (1.702 m)  Wt 237 lb (107.502 kg)  BMI 37.11 kg/m2  SpO2 99% Physical Exam  Nursing note and vitals reviewed. Constitutional: She is oriented to person, place, and time. She appears well-developed and well-nourished.  HENT:  Head: Normocephalic.  Mouth/Throat: No oropharyngeal exudate.  Eyes: Conjunctivae and EOM are normal. Pupils are equal, round, and reactive to light.  Neck: Normal range of motion. Neck supple.  Cardiovascular: Normal rate, regular rhythm, normal heart sounds and intact distal pulses.  Exam reveals no gallop and no friction rub.   No murmur heard. Pulmonary/Chest: Effort normal and breath sounds normal. No respiratory distress. She has no wheezes.  Abdominal: Soft. Bowel sounds are normal. There is no tenderness. There is no rebound and no guarding.  Musculoskeletal: Normal range of motion. She exhibits no edema and no tenderness.  Neurological: She is alert and oriented to person, place, and time.  Skin: Skin is warm and dry.  Psychiatric: She has a normal mood and affect. Her behavior is normal.    ED Course  Procedures (including critical care time) Labs Review Labs Reviewed  COMPREHENSIVE METABOLIC PANEL - Abnormal; Notable for the following:    Glucose, Bld 160 (*)    All other components within normal limits  CBC WITH DIFFERENTIAL  TROPONIN I  PROTIME-INR  PRO B NATRIURETIC PEPTIDE  TROPONIN I    Imaging Review Dg Chest 2 View  09/19/2013   CLINICAL DATA:  Hypertension and left upper chest pain  EXAM: CHEST  2 VIEW  COMPARISON:  DG CHEST 2 VIEW dated 10/02/2003  FINDINGS: The lungs are adequately inflated. There is no focal infiltrate. The cardiopericardial silhouette is normal in  size. The pulmonary vascularity is not engorged. The mediastinum is normal in width. There is no pleural effusion. The observed portions of the bony thorax appear normal.  IMPRESSION: There is no evidence of active cardiopulmonary disease.   Electronically Signed   By: David  Martinique   On: 09/19/2013 13:42     EKG Interpretation   Date/Time:  Thursday Sep 19 2013 11:51:19 EDT Ventricular Rate:  82 PR Interval:  129 QRS Duration: 106 QT Interval:  397 QTC Calculation: 464 R Axis:   -54 Text Interpretation:  Sinus rhythm LAD, consider left anterior fascicular  block Anterior infarct, old Otherwise no significant change Confirmed by  Katieann Hungate  MD, Zebulun Deman ([id number]) on 09/19/2013 12:11:46 PM      MDM   Final diagnoses:  Chest pain    12:07 PM 58 y.o. female w hx of DM, asthma, HTN who presents  with chest pain. She states that she began having some chest tightness and shortness of breath 2 nights ago while at rest in bed. She notes that her pain has been persistent since that time and slightly worse when lying supine. She is afebrile and vital signs are unremarkable here. RF's for cardiac dis include DM, HTN, FH. Will get screening labwork, aspirin, nitroglycerin.  Delta trop neg. Discussed w/ Dr. Frederico Hamman (pcp who sent her here). She would like pt evaluated by Cardiology to decide if pt ok to get workup as outpt or needs admission and stress test.   Pt evaluated by cards who recommends outpt workup. Return precautions provided.   Blanchard Kelch, MD 09/19/13 2101

## 2013-09-19 NOTE — Discharge Instructions (Signed)
Take 81mg  aspirin once daily.  Your caregiver has diagnosed you as having chest pain that is not specific for one problem, but does not require admission.  You are at low risk for an acute heart condition or other serious illness. Chest pain comes from many different causes.  SEEK IMMEDIATE MEDICAL ATTENTION IF: You have severe chest pain, especially if the pain is crushing or pressure-like and spreads to the arms, back, neck, or jaw, or if you have sweating, nausea (feeling sick to your stomach), or shortness of breath. THIS IS AN EMERGENCY. Don't wait to see if the pain will go away. Get medical help at once. Call 911 or 0 (operator). DO NOT drive yourself to the hospital.  Your chest pain gets worse and does not go away with rest.  You have an attack of chest pain lasting longer than usual, despite rest and treatment with the medications your caregiver has prescribed.  You wake from sleep with chest pain or shortness of breath.  You feel dizzy or faint.  You have chest pain not typical of your usual pain for which you originally saw your caregiver.

## 2013-09-19 NOTE — ED Notes (Signed)
Pt concerned about delay with cardiology- cardiology re-paged.

## 2013-09-20 ENCOUNTER — Telehealth: Payer: Self-pay | Admitting: Cardiology

## 2013-09-20 DIAGNOSIS — R079 Chest pain, unspecified: Secondary | ICD-10-CM

## 2013-09-20 NOTE — Telephone Encounter (Signed)
New problem    Pt called to set up a stress test per pt she was told to do so. Pt was seen at the ER by Dr. Marlou Porch.   Please place and order of type of Stress test and pt needs a call back on her cell phone.

## 2013-09-23 NOTE — Telephone Encounter (Signed)
Follow Up  Pt called back to follow Up. Requests a call back to discuss a possible stress test. Pt is unaware of the type of stress test needed. Please call.

## 2013-09-23 NOTE — Telephone Encounter (Signed)
LVM for patient to call the office

## 2013-09-27 NOTE — Telephone Encounter (Signed)
F/u ° ° °Pt returning call from nurse. °

## 2013-10-04 NOTE — Telephone Encounter (Signed)
Left message to call back  

## 2013-10-04 NOTE — Telephone Encounter (Signed)
Per ED note patient needed a stress myoview. Spoke with patient and she has bad knees. Discussed with Dr Marlou Porch and ok to change to [facility]. Advised patient and will send to precert and [facility]

## 2013-10-08 NOTE — Telephone Encounter (Signed)
Patient has been scheduled and notified by [facility]

## 2013-10-17 ENCOUNTER — Ambulatory Visit (HOSPITAL_COMMUNITY): Payer: BC Managed Care – PPO | Attending: Internal Medicine | Admitting: Radiology

## 2013-10-17 VITALS — BP 142/80 | Ht 67.0 in | Wt 235.0 lb

## 2013-10-17 DIAGNOSIS — R0989 Other specified symptoms and signs involving the circulatory and respiratory systems: Secondary | ICD-10-CM | POA: Insufficient documentation

## 2013-10-17 DIAGNOSIS — E119 Type 2 diabetes mellitus without complications: Secondary | ICD-10-CM | POA: Insufficient documentation

## 2013-10-17 DIAGNOSIS — Z87891 Personal history of nicotine dependence: Secondary | ICD-10-CM | POA: Insufficient documentation

## 2013-10-17 DIAGNOSIS — J45909 Unspecified asthma, uncomplicated: Secondary | ICD-10-CM | POA: Insufficient documentation

## 2013-10-17 DIAGNOSIS — R0609 Other forms of dyspnea: Secondary | ICD-10-CM | POA: Insufficient documentation

## 2013-10-17 DIAGNOSIS — I10 Essential (primary) hypertension: Secondary | ICD-10-CM | POA: Insufficient documentation

## 2013-10-17 DIAGNOSIS — R079 Chest pain, unspecified: Secondary | ICD-10-CM | POA: Insufficient documentation

## 2013-10-17 DIAGNOSIS — R002 Palpitations: Secondary | ICD-10-CM | POA: Insufficient documentation

## 2013-10-17 DIAGNOSIS — R0602 Shortness of breath: Secondary | ICD-10-CM | POA: Insufficient documentation

## 2013-10-17 DIAGNOSIS — Z794 Long term (current) use of insulin: Secondary | ICD-10-CM | POA: Insufficient documentation

## 2013-10-17 MED ORDER — TECHNETIUM TC 99M SESTAMIBI GENERIC - CARDIOLITE
33.0000 | Freq: Once | INTRAVENOUS | Status: AC | PRN
  Administered 2013-10-17: 33 via INTRAVENOUS

## 2013-10-17 MED ORDER — REGADENOSON 0.4 MG/5ML IV SOLN
0.4000 mg | Freq: Once | INTRAVENOUS | Status: AC
  Administered 2013-10-17: 0.4 mg via INTRAVENOUS

## 2013-10-17 NOTE — Progress Notes (Signed)
[place] [facility] [address] [place], [place] [address] [phone]    Cardiology Nuclear Med Study  Susan Morrow is a 58 y.o. female     MRN : [id number]     DOB: 1955-07-06  Procedure Date: 10/17/2013  Nuclear Med Background Indication for Stress Test:  Evaluation for Ischemia, [facility] and 5/15 CP, palpitations, (-)enzymes Non Specific ST T changes History:  Asthma Cardiac Risk Factors: History of Smoking, Hypertension and IDDM Type 1  Symptoms:  Chest Pain, DOE, Fatigue, Palpitations and SOB   Nuclear Pre-Procedure Caffeine/Decaff Intake:  None NPO After: 3:00am   Lungs:  clear O2 Sat: 96% on room air. IV 0.9% NS with Angio Cath:  22g  IV Site: R Forearm  IV Started by:  Perrin Maltese, EMT-P  Chest Size (in):  40 Cup Size: D  Height: 5\' 7"  (1.702 m)  Weight:  235 lb (106.595 kg)  BMI:  Body mass index is 36.8 kg/(m^2). Tech Comments:  Nexium this am CBG 180 mg/dl No insulin    Nuclear Med Study 1 or 2 day study: 2 day  Stress Test Type:  Carlton Adam  Reading MD: n/a  Order Authorizing Provider:  Marlou Porch MD  Resting Radionuclide: Technetium 71m Sestamibi  Resting Radionuclide Dose: 33.0 mCi on 10/21/13   Stress Radionuclide:  Technetium 44m Sestamibi  Stress Radionuclide Dose: 33.0 mCi on 10/17/13           Stress Protocol Rest HR: 90 Stress HR: 101  Rest BP: 142/80 Stress BP: 143/79  Exercise Time (min): n/a METS: n/a   Predicted Max HR: 161 bpm % Max HR: 62.73 bpm Rate Pressure Product: 14443   Dose of Adenosine (mg):  n/a Dose of Lexiscan: 0.4 mg  Dose of Atropine (mg): n/a Dose of Dobutamine: n/a mcg/kg/min (at max HR)  Stress Test Technologist: Perrin Maltese, EMT-P  Nuclear Technologist:  Charlton Amor, CNMT     Rest Procedure:  Myocardial perfusion imaging was performed at rest 45 minutes following the intravenous administration of Technetium 35m Sestamibi. Rest ECG: NSR with non-specific ST-T wave changes  Stress  Procedure:  The patient received IV Lexiscan 0.4 mg over 15-seconds.  Technetium 30m Sestamibi injected at 30-seconds. This patient had dizziness, sob, and chest tightness with the Lexiscan injection. Quantitative spect images were obtained after a 45 minute delay. Stress ECG: No significant change from baseline ECG  QPS Raw Data Images:  Normal; no motion artifact; normal heart/lung ratio. Stress Images:  Normal homogeneous uptake in all areas of the myocardium. Rest Images:  Normal homogeneous uptake in all areas of the myocardium. Subtraction (SDS):  No evidence of ischemia. Transient Ischemic Dilatation (Normal <1.22):  1.11 Lung/Heart Ratio (Normal <0.45):  0.24  Quantitative Gated Spect Images QGS EDV:  78 ml QGS ESV:  28 ml  Impression Exercise Capacity:  Lexiscan with no exercise. BP Response:  Normal blood pressure response. Clinical Symptoms:  Mild chest pain/dyspnea. ECG Impression:  No significant ST segment change suggestive of ischemia. Comparison with Prior Nuclear Study: No previous nuclear study performed  Overall Impression:  Normal stress nuclear study.  LV Ejection Fraction: 64%.  LV Wall Motion:  NL LV Function; NL Wall Motion   Darlin Coco MD

## 2013-10-21 ENCOUNTER — Ambulatory Visit (HOSPITAL_COMMUNITY): Payer: BC Managed Care – PPO | Attending: Cardiology

## 2013-10-21 DIAGNOSIS — R0989 Other specified symptoms and signs involving the circulatory and respiratory systems: Secondary | ICD-10-CM

## 2013-10-21 MED ORDER — TECHNETIUM TC 99M SESTAMIBI GENERIC - CARDIOLITE
33.0000 | Freq: Once | INTRAVENOUS | Status: AC | PRN
  Administered 2013-10-21: 33 via INTRAVENOUS

## 2014-10-02 DIAGNOSIS — G250 Essential tremor: Secondary | ICD-10-CM | POA: Insufficient documentation

## 2015-03-02 IMAGING — CR DG CHEST 2V
2 series · 2 of 2 positions shown · non-contrast
Comparison: DG CHEST 2 VIEW dated 10/02/2003

CLINICAL DATA: Hypertension and left upper chest pain

EXAM:
CHEST  2 VIEW

[w chest pa]
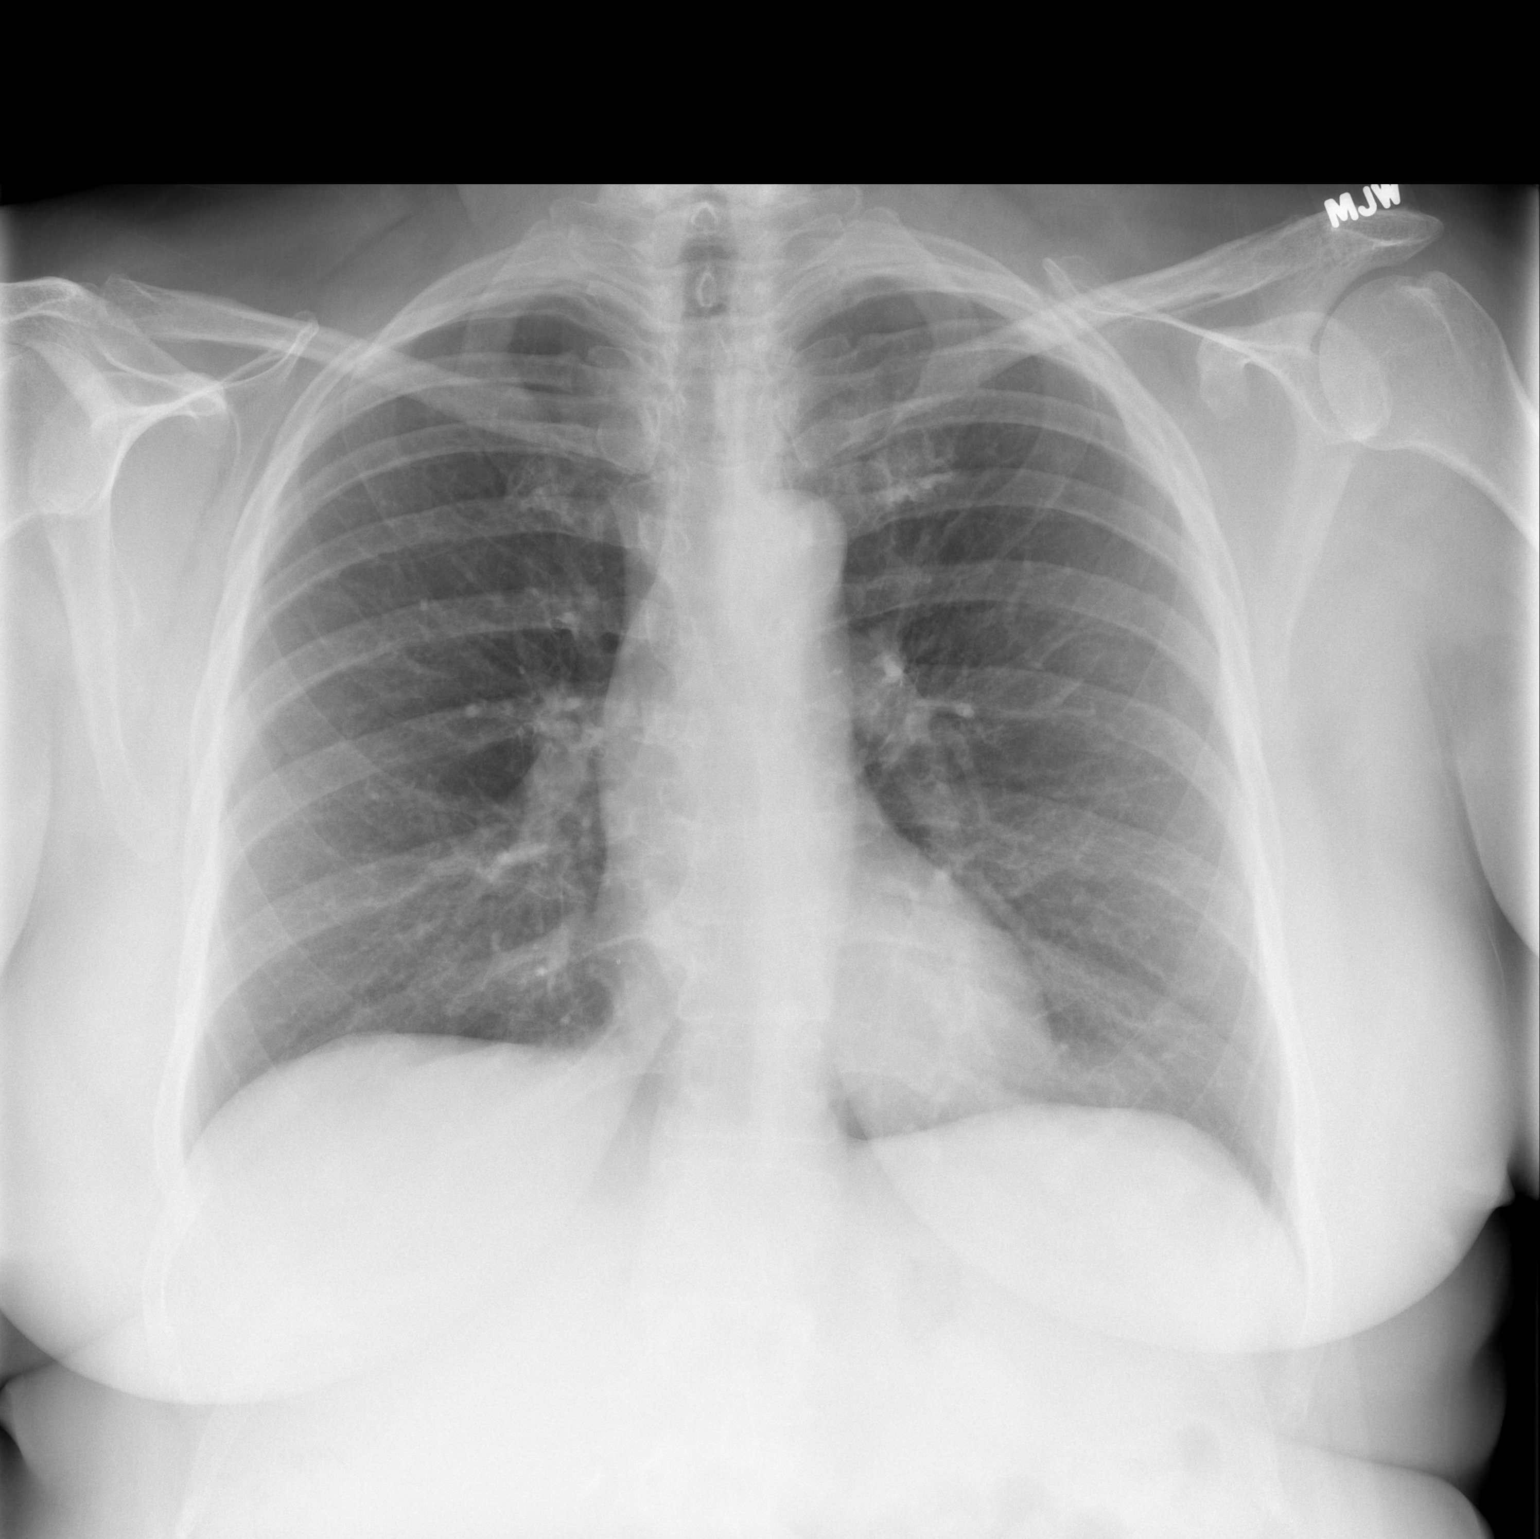

[w chest lat]
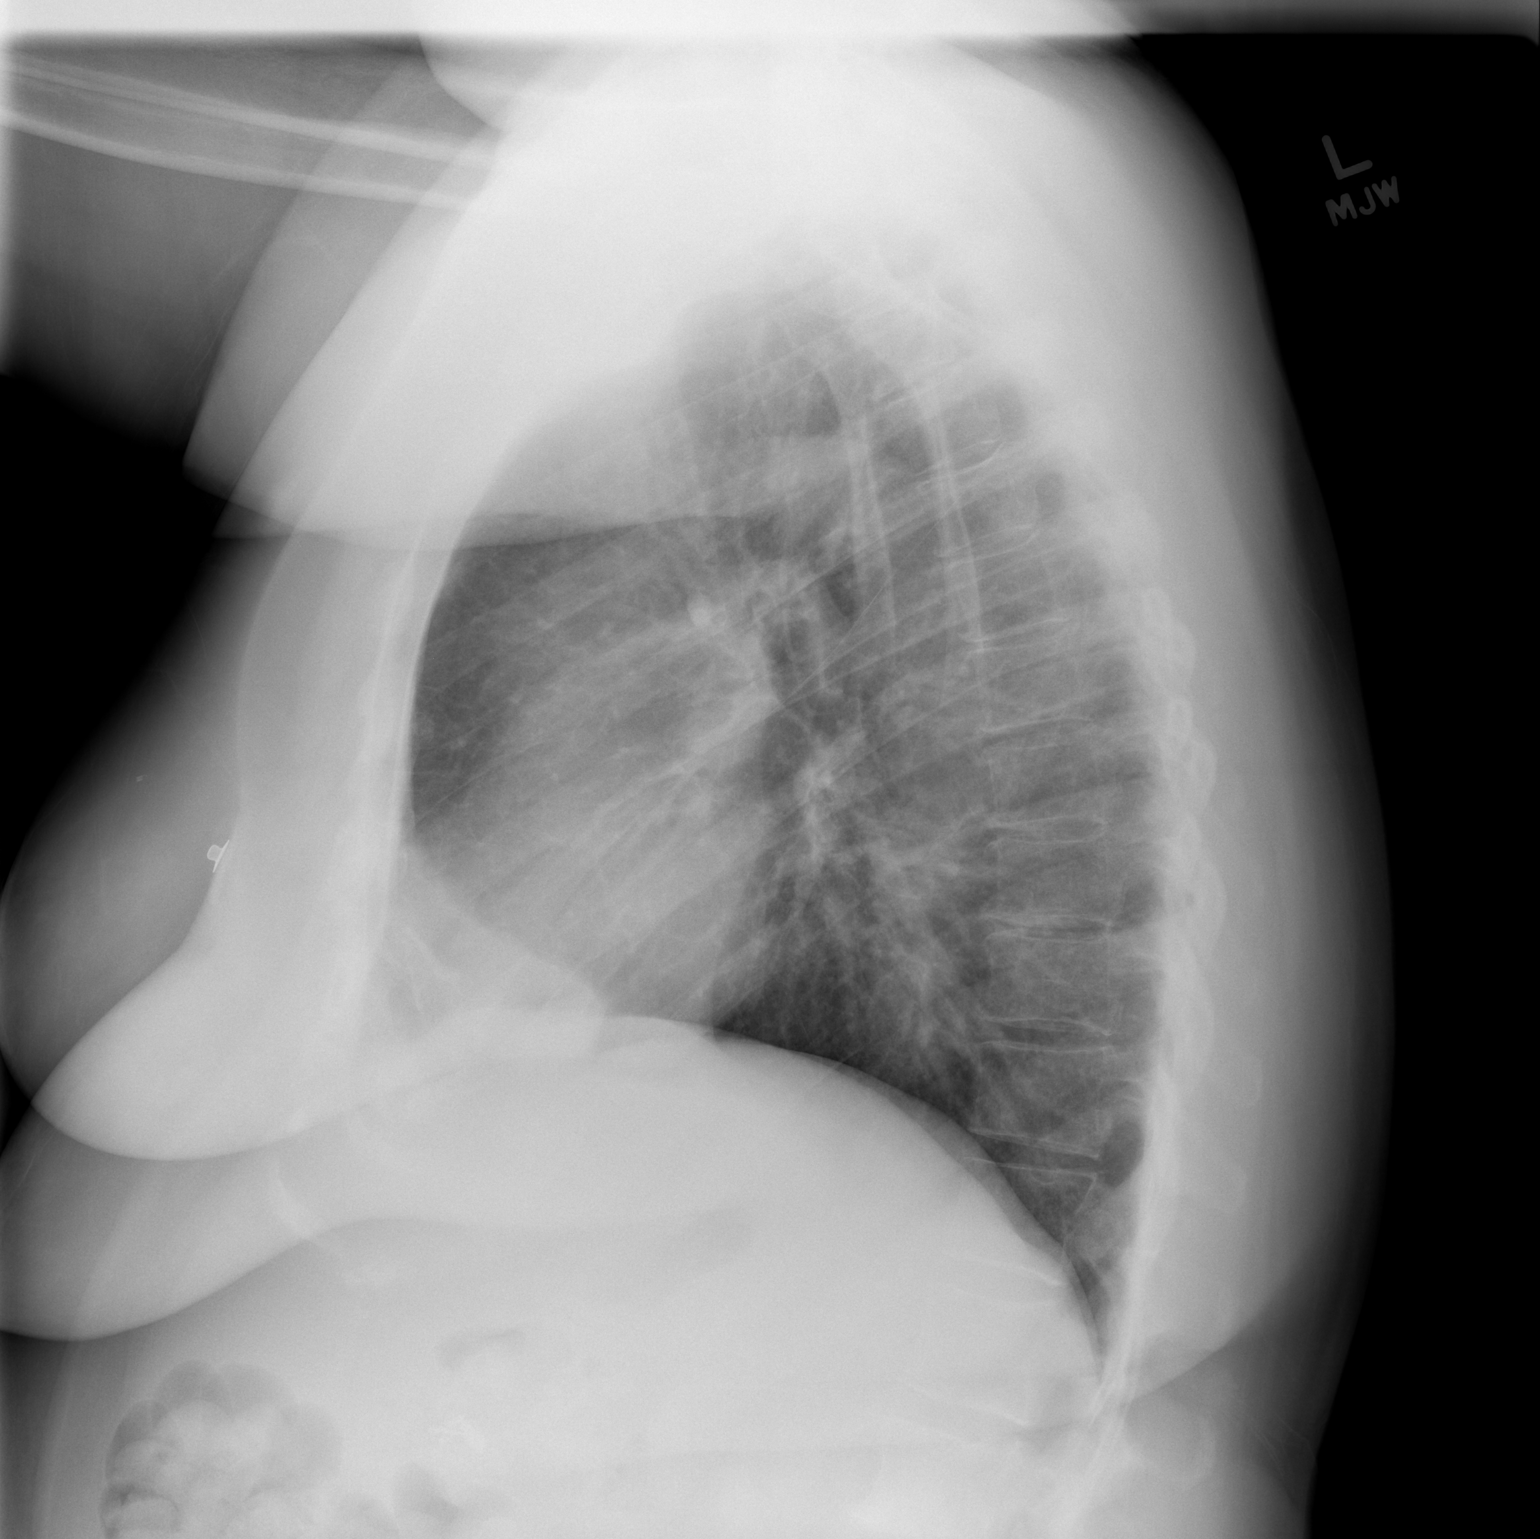

[2 of 2 positions shown; findings below may reference images not displayed]

FINDINGS: The lungs are adequately inflated. There is no focal infiltrate. The
cardiopericardial silhouette is normal in size. The pulmonary
vascularity is not engorged. The mediastinum is normal in width.
There is no pleural effusion. The observed portions of the bony
thorax appear normal.
IMPRESSION: There is no evidence of active cardiopulmonary disease.

## 2016-12-08 DIAGNOSIS — G4733 Obstructive sleep apnea (adult) (pediatric): Secondary | ICD-10-CM | POA: Insufficient documentation

## 2017-09-11 DIAGNOSIS — R911 Solitary pulmonary nodule: Secondary | ICD-10-CM | POA: Insufficient documentation

## 2017-11-23 DIAGNOSIS — J4520 Mild intermittent asthma, uncomplicated: Secondary | ICD-10-CM | POA: Insufficient documentation

## 2019-02-14 DIAGNOSIS — D751 Secondary polycythemia: Secondary | ICD-10-CM | POA: Insufficient documentation

## 2019-08-01 DIAGNOSIS — R718 Other abnormality of red blood cells: Secondary | ICD-10-CM | POA: Insufficient documentation

## 2020-05-07 DIAGNOSIS — L900 Lichen sclerosus et atrophicus: Secondary | ICD-10-CM | POA: Insufficient documentation

## 2020-08-13 DIAGNOSIS — M222X9 Patellofemoral disorders, unspecified knee: Secondary | ICD-10-CM | POA: Insufficient documentation

## 2020-11-18 ENCOUNTER — Telehealth: Payer: Self-pay | Admitting: *Deleted

## 2021-01-04 NOTE — Telephone Encounter (Signed)
error 

## 2021-06-07 DIAGNOSIS — G249 Dystonia, unspecified: Secondary | ICD-10-CM | POA: Insufficient documentation

## 2022-07-12 DIAGNOSIS — Z9181 History of falling: Secondary | ICD-10-CM | POA: Insufficient documentation

## 2022-07-12 DIAGNOSIS — H8111 Benign paroxysmal vertigo, right ear: Secondary | ICD-10-CM | POA: Insufficient documentation

## 2022-07-12 DIAGNOSIS — M542 Cervicalgia: Secondary | ICD-10-CM | POA: Insufficient documentation

## 2022-07-15 ENCOUNTER — Encounter (HOSPITAL_COMMUNITY): Payer: Self-pay | Admitting: *Deleted

## 2022-07-15 ENCOUNTER — Emergency Department (HOSPITAL_COMMUNITY): Payer: HMO

## 2022-07-15 ENCOUNTER — Other Ambulatory Visit: Payer: Self-pay

## 2022-07-15 ENCOUNTER — Emergency Department (HOSPITAL_COMMUNITY)
Admission: EM | Admit: 2022-07-15 | Discharge: 2022-07-15 | Disposition: A | Discharge status: Home / Self Care | Payer: HMO | Attending: Emergency Medicine | Admitting: Emergency Medicine

## 2022-07-15 DIAGNOSIS — K625 Hemorrhage of anus and rectum: Secondary | ICD-10-CM

## 2022-07-15 DIAGNOSIS — Z794 Long term (current) use of insulin: Secondary | ICD-10-CM | POA: Insufficient documentation

## 2022-07-15 DIAGNOSIS — K529 Noninfective gastroenteritis and colitis, unspecified: Secondary | ICD-10-CM | POA: Diagnosis not present

## 2022-07-15 DIAGNOSIS — E119 Type 2 diabetes mellitus without complications: Secondary | ICD-10-CM | POA: Diagnosis not present

## 2022-07-15 DIAGNOSIS — Z79899 Other long term (current) drug therapy: Secondary | ICD-10-CM | POA: Diagnosis not present

## 2022-07-15 DIAGNOSIS — I10 Essential (primary) hypertension: Secondary | ICD-10-CM | POA: Insufficient documentation

## 2022-07-15 DIAGNOSIS — E876 Hypokalemia: Secondary | ICD-10-CM | POA: Insufficient documentation

## 2022-07-15 DIAGNOSIS — D72829 Elevated white blood cell count, unspecified: Secondary | ICD-10-CM | POA: Insufficient documentation

## 2022-07-15 DIAGNOSIS — Z7984 Long term (current) use of oral hypoglycemic drugs: Secondary | ICD-10-CM | POA: Insufficient documentation

## 2022-07-15 DIAGNOSIS — R109 Unspecified abdominal pain: Secondary | ICD-10-CM | POA: Diagnosis present

## 2022-07-15 LAB — CBC WITH DIFFERENTIAL/PLATELET
Abs Immature Granulocytes: 0.04 10*3/uL (ref 0.00–0.07)
Basophils Absolute: 0.1 10*3/uL (ref 0.0–0.1)
Basophils Relative: 1 %
Eosinophils Absolute: 0.5 10*3/uL (ref 0.0–0.5)
Eosinophils Relative: 4 %
HCT: 43 % (ref 36.0–46.0)
Hemoglobin: 14.2 g/dL (ref 12.0–15.0)
Immature Granulocytes: 0 %
Lymphocytes Relative: 31 %
Lymphs Abs: 3.2 10*3/uL (ref 0.7–4.0)
MCH: 28.5 pg (ref 26.0–34.0)
MCHC: 33 g/dL (ref 30.0–36.0)
MCV: 86.2 fL (ref 80.0–100.0)
Monocytes Absolute: 0.8 10*3/uL (ref 0.1–1.0)
Monocytes Relative: 7 %
Neutro Abs: 6 10*3/uL (ref 1.7–7.7)
Neutrophils Relative %: 57 %
Platelets: 258 10*3/uL (ref 150–400)
RBC: 4.99 MIL/uL (ref 3.87–5.11)
RDW: 12.5 % (ref 11.5–15.5)
WBC: 10.6 10*3/uL — ABNORMAL HIGH (ref 4.0–10.5)
nRBC: 0 % (ref 0.0–0.2)

## 2022-07-15 LAB — COMPREHENSIVE METABOLIC PANEL
ALT: 23 U/L (ref 0–44)
AST: 23 U/L (ref 15–41)
Albumin: 3.9 g/dL (ref 3.5–5.0)
Alkaline Phosphatase: 113 U/L (ref 38–126)
Anion gap: 10 (ref 5–15)
BUN: 20 mg/dL (ref 8–23)
CO2: 22 mmol/L (ref 22–32)
Calcium: 8.6 mg/dL — ABNORMAL LOW (ref 8.9–10.3)
Chloride: 106 mmol/L (ref 98–111)
Creatinine, Ser: 0.81 mg/dL (ref 0.44–1.00)
GFR, Estimated: >60 mL/min (ref >60)
Glucose, Bld: 142 mg/dL — ABNORMAL HIGH (ref 70–99)
Potassium: 3.3 mmol/L — ABNORMAL LOW (ref 3.5–5.1)
Sodium: 138 mmol/L (ref 135–145)
Total Bilirubin: 0.6 mg/dL (ref 0.3–1.2)
Total Protein: 7.3 g/dL (ref 6.5–8.1)

## 2022-07-15 LAB — URINALYSIS, ROUTINE W REFLEX MICROSCOPIC
Bacteria, UA: NONE SEEN
Bilirubin Urine: NEGATIVE
Glucose, UA: >=500 mg/dL — AB
Hgb urine dipstick: NEGATIVE
Ketones, ur: NEGATIVE mg/dL
Leukocytes,Ua: NEGATIVE
Nitrite: NEGATIVE
Protein, ur: NEGATIVE mg/dL
Specific Gravity, Urine: 1.035 — ABNORMAL HIGH (ref 1.005–1.030)
pH: 5 (ref 5.0–8.0)

## 2022-07-15 LAB — LIPASE, BLOOD: Lipase: 47 U/L (ref 11–51)

## 2022-07-15 MED ORDER — SODIUM CHLORIDE (PF) 0.9 % IJ SOLN
INTRAMUSCULAR | Status: AC
  Filled 2022-07-15: qty 50

## 2022-07-15 MED ORDER — LACTATED RINGERS IV BOLUS
1000.0000 mL | Freq: Once | INTRAVENOUS | Status: AC
  Administered 2022-07-15: 1000 mL via INTRAVENOUS

## 2022-07-15 MED ORDER — IOHEXOL 300 MG/ML  SOLN
100.0000 mL | Freq: Once | INTRAMUSCULAR | Status: AC | PRN
  Administered 2022-07-15: 100 mL via INTRAVENOUS

## 2022-07-15 MED ORDER — AMOXICILLIN-POT CLAVULANATE 875-125 MG PO TABS: 1.0000 | ORAL_TABLET | Freq: Two times a day (BID) | ORAL | 0 refills | Status: DC

## 2022-07-15 MED ORDER — AMOXICILLIN-POT CLAVULANATE 875-125 MG PO TABS
1.0000 | ORAL_TABLET | Freq: Once | ORAL | Status: AC
  Administered 2022-07-15: 1 via ORAL
  Filled 2022-07-15: qty 1

## 2022-07-15 MED ORDER — POTASSIUM CHLORIDE CRYS ER 20 MEQ PO TBCR
40.0000 meq | EXTENDED_RELEASE_TABLET | Freq: Once | ORAL | Status: AC
  Administered 2022-07-15: 40 meq via ORAL
  Filled 2022-07-15: qty 2

## 2022-07-15 NOTE — Discharge Instructions (Addendum)
You were seen in the emergency department for your abdominal pain and your bloody diarrhea.  Your workup shows that you have colitis and this will be treated with antibiotics he should complete this as prescribed.  You can follow-up with your primary doctor and your GI doctor to have your symptoms rechecked and see if you may benefit from another colonoscopy.  He should return to the emergency department for significantly worsening pain, if you are having large amounts of bleeding, you are becoming lightheaded or pass out or if you have any other new or concerning symptoms.

## 2022-07-15 NOTE — ED Triage Notes (Addendum)
Here by POV from home for GI bleeding associated with 1 episode of diarrhea, followed by bloody mucus, nausea, some back pain and minimal LLQ abd pain. Denies fever, chills, vomiting, syncope. Recently finished ABT for sinus infection. Denies foreign travel. Seeing PT for vestibular dizziness which is worse since blood loss. Sx onset yesterday, gradually progressively worse. H/o gastroenteritis x1 and diverticulitis. GI Dr. Verdia Kuba. Recent blood work today with [facility] PCP. EDPA present at time of triage.

## 2022-07-15 NOTE — ED Notes (Signed)
EDP at [facility] 

## 2022-07-15 NOTE — ED Provider Notes (Signed)
[place] EMERGENCY DEPARTMENT AT [facility] Provider Note   CSN: [id number] Arrival date & time: 07/15/22  [id number]     History  Chief Complaint  Patient presents with   GI Bleeding    Susan Morrow is a 67 y.o. female.  Patient is a 67 year old female with a past medical history of hypertension and diabetes presenting to the emergency department with GI bleeding.  The patient states that yesterday she started to have abdominal cramping and had few episodes of loose stool that then turned into mucousy/bloody appearing bowel movements.  She states that she had nausea yesterday without any vomiting.  She states that the abdominal cramping and the nausea has resolved but she has continued to have bloody and mucousy appearing bowel movements.  She states that she felt warm at home but did not measure a fever.  She denies any dysuria or hematuria.  She denies any blood thinner use.  She states that several years ago she had an abnormal colonoscopy but the last few since then have been normal.  The history is provided by the patient.       Home Medications Prior to Admission medications   Medication Sig Start Date End Date Taking? Authorizing Provider  amoxicillin-clavulanate (AUGMENTIN) 875-125 MG tablet Take 1 tablet by mouth every 12 (twelve) hours. 07/15/22  Yes Maylon Peppers, Jordan Hawks K, DO  albuterol (PROVENTIL HFA;VENTOLIN HFA) 108 (90 BASE) MCG/ACT inhaler Inhale 2 puffs into the lungs every 6 (six) hours as needed for wheezing or shortness of breath.    [provider]  ALPRAZolam Duanne Moron) 0.5 MG tablet Take 0.5 mg by mouth at bedtime as needed for anxiety or sleep.    [provider]  amLODipine (NORVASC) 2.5 MG tablet Take 2.5 mg by mouth daily.    [provider]  cetirizine (ZYRTEC) 10 MG tablet Take 10 mg by mouth daily as needed for allergies.     [provider]  Chromium-Cinnamon (CINNAMON PLUS CHROMIUM PO) Take 1 capsule by mouth 2  (two) times daily.    [provider]  clobetasol cream (TEMOVATE) [id number] % Apply 1 application topically 2 (two) times daily as needed (for sclerosis).    [provider]  cyclobenzaprine (FLEXERIL) 10 MG tablet Take 10 mg by mouth 3 (three) times daily as needed for muscle spasms.    [provider]  EPINEPHrine (EPIPEN) 0.3 mg/0.3 mL IJ SOAJ injection Inject 0.3 mg into the muscle once as needed (for allergic reaction).    [provider]  esomeprazole (NEXIUM) 40 MG capsule Take 40 mg by mouth daily at 12 noon.    [provider]  glimepiride (AMARYL) 4 MG tablet Take 4 mg by mouth 2 (two) times daily.    [provider]  hydrochlorothiazide (HYDRODIURIL) 25 MG tablet Take 25 mg by mouth daily.     [provider]  insulin glargine (LANTUS) 100 UNIT/ML injection Inject 70 Units into the skin at bedtime.    [provider]  meloxicam (MOBIC) 7.5 MG tablet Take 7.5 mg by mouth daily.    [provider]  oxyCODONE-acetaminophen (PERCOCET/ROXICET) 5-325 MG per tablet Take 1 tablet by mouth every 6 (six) hours as needed for severe pain.    [provider]  quinapril (ACCUPRIL) 10 MG tablet Take 10 mg by mouth at bedtime.    [provider]  sertraline (ZOLOFT) 100 MG tablet Take 200 mg by mouth at bedtime.  06/11/11   [provider]  sitaGLIPtin (JANUVIA) 100 MG tablet Take 100 mg by mouth daily.    [provider]  valACYclovir (VALTREX) 1000 MG tablet Take 1,000 mg by mouth 2 (two) times daily as needed (for outbreak).    [provider]  Vitamin D, Ergocalciferol, (DRISDOL) 50000 UNITS CAPS capsule Take 50,000 Units by mouth every 7 (seven) days.    [provider]      Allergies    Shrimp [shellfish allergy], Spironolactone, Promethazine hcl, Lortab [hydrocodone-acetaminophen], Vigamox [moxifloxacin], and Hydrocodone-acetaminophen    Review of Systems   Review of  Systems  Physical Exam Updated Vital Signs BP (!) 142/92   Pulse 92   Temp 97.9 F (36.6 C) (Oral)   Resp 18   Ht '5\' 7"'$  (1.702 m)   Wt 100.2 kg   SpO2 96%   BMI 34.61 kg/m  Physical Exam Vitals and nursing note reviewed. Exam conducted with a chaperone present.  Constitutional:      General: She is not in acute distress.    Appearance: Normal appearance.  HENT:     Head: Normocephalic and atraumatic.     Nose: Nose normal.     Mouth/Throat:     Mouth: Mucous membranes are moist.     Pharynx: Oropharynx is clear.  Eyes:     Extraocular Movements: Extraocular movements intact.     Conjunctiva/sclera: Conjunctivae normal.  Cardiovascular:     Rate and Rhythm: Normal rate and regular rhythm.     Heart sounds: Normal heart sounds.  Pulmonary:     Effort: Pulmonary effort is normal.     Breath sounds: Normal breath sounds.  Abdominal:     General: Abdomen is flat.     Palpations: Abdomen is soft.     Tenderness: There is no abdominal tenderness.  Genitourinary:    Comments: No visible external hemorrhoids or fissures, no active bleeding on rectal exam, no stool in the vault Musculoskeletal:        General: Normal range of motion.     Cervical back: Normal range of motion and neck supple.  Skin:    General: Skin is warm and dry.  Neurological:     General: No focal deficit present.     Mental Status: She is alert and oriented to person, place, and time.  Psychiatric:        Mood and Affect: Mood normal.        Behavior: Behavior normal.     ED Results / Procedures / Treatments   Labs (all labs ordered are listed, but only abnormal results are displayed) Labs Reviewed  CBC WITH DIFFERENTIAL/PLATELET - Abnormal; Notable for the following components:      Result Value   WBC 10.6 (*)    All other components within normal limits  COMPREHENSIVE METABOLIC PANEL - Abnormal; Notable for the following components:   Potassium 3.3 (*)    Glucose, Bld 142 (*)    Calcium  8.6 (*)    All other components within normal limits  URINALYSIS, ROUTINE W REFLEX MICROSCOPIC - Abnormal; Notable for the following components:   Specific Gravity, Urine 1.035 (*)    Glucose, UA >=500 (*)    All other components within normal limits  GASTROINTESTINAL PANEL BY PCR, STOOL (REPLACES STOOL CULTURE)  C DIFFICILE QUICK SCREEN W PCR REFLEX    LIPASE, BLOOD    EKG None  Radiology CT Abdomen Pelvis W Contrast  Result Date: 07/15/2022 CLINICAL DATA:  Acute nonlocalized abdominal pain. Patient reports  blood in stool. Nausea and diarrhea question GI bleed EXAM: CT ABDOMEN AND PELVIS WITH CONTRAST TECHNIQUE: Multidetector CT imaging of the abdomen and pelvis was performed using the standard protocol following bolus administration of intravenous contrast. RADIATION DOSE REDUCTION: This exam was performed according to the departmental dose-optimization program which includes automated exposure control, adjustment of the mA and/or kV according to patient size and/or use of iterative reconstruction technique. CONTRAST:  132m OMNIPAQUE IOHEXOL 300 MG/ML  SOLN COMPARISON:  None Available. FINDINGS: Lower chest: No acute or focal airspace disease, allowing for mild motion artifact. Hepatobiliary: Diffuse hepatic steatosis. The liver is upper normal in size spanning 17.1 cm cranial caudal. 10 mm hypodensity in the right dome of the liver, series 2, image 15, nonspecific. Clips in the gallbladder fossa postcholecystectomy. No biliary dilatation. Pancreas: No ductal dilatation or inflammation. Spleen: Normal in size without focal abnormality. Splenule inferiorly. Adrenals/Urinary Tract: Normal right adrenal gland. Subcentimeter thickening of the left adrenal gland. No hydronephrosis or perinephric edema. Homogeneous renal enhancement with symmetric excretion on delayed phase imaging. No renal calculi. Subcentimeter low-density lesions in the right kidney are too small to characterize, likely small cysts.  No specific imaging follow-up is needed. Urinary bladder is partially distended without wall thickening. Stomach/Bowel: Wall thickening with pericolonic edema involving the distal sigmoid colon and rectum. No abscess or perforation. Small to moderate volume of stool in the colon. There is fecalization of distal small bowel contents. No obstruction. Normal appendix. Vascular/Lymphatic: Mild aortic atherosclerosis. No aortic aneurysm. No enlarged lymph nodes in the abdomen or pelvis. Reproductive: Uterus and bilateral adnexa are unremarkable. Other: No free air, ascites or focal fluid collection. Soft tissue densities in the anterior abdominal wall subcutaneous tissues typical of medication injection sites. Musculoskeletal: Scoliosis and moderate degenerative change in the spine. There are no acute or suspicious osseous abnormalities. IMPRESSION: 1. Wall thickening with pericolonic edema involving the distal sigmoid colon and rectum, consistent with colitis, likely infectious or inflammatory. 2. Hepatic steatosis. 10 mm hypodensity in the right dome of the liver is nonspecific. Recommend nonemergent hepatic protocol MRI for characterization on an elective basis. Aortic Atherosclerosis (ICD10-I70.0). Electronically Signed   By: MKeith RakeM.D.   On: 07/15/2022 20:15    Procedures Procedures    Medications Ordered in ED Medications  amoxicillin-clavulanate (AUGMENTIN) 875-125 MG per tablet 1 tablet (has no administration in time range)  lactated ringers bolus 1,000 mL (1,000 mLs Intravenous New Bag/Given 07/15/22 1806)  potassium chloride SA (KLOR-CON M) CR tablet 40 mEq (40 mEq Oral Given 07/15/22 1756)  sodium chloride (PF) 0.9 % injection (  Given by Other 07/15/22 2028)  iohexol (OMNIPAQUE) 300 MG/ML solution 100 mL (100 mLs Intravenous Contrast Given 07/15/22 1948)    ED Course/ Medical Decision Making/ A&P Clinical Course as of 07/15/22 2105  Fri Jul 15, 2022  2023 Colitis on CTAP, which is  consistent with her recent diarrhea and likely cause of bleeding. She has not had a watery bowel movement here that we have been able to collect making C diff less likely. She has had no further bleeding in the ED and is not anemic. She will be given antibiotics and gi follow up. [VK]    Clinical Course User Index [VK] KKemper Durie DO                             Medical Decision Making This patient presents to the ED with chief complaint(s)  of GI bleed with pertinent past medical history of HTN, DM which further complicates the presenting complaint. The complaint involves an extensive differential diagnosis and also carries with it a high risk of complications and morbidity.    The differential diagnosis includes anemia, GI bleed, no evidence of hemorrhoids or fissures on exam, no active hemorrhage on exam, gastroenteritis, colitis, diverticulitis or diverticulosis, considering C. difficile with recent antibiotic use however patient has not had significant diarrhea making this less likely  Additional history obtained: Additional history obtained from N/A Records reviewed Care Everywhere/External Records  ED Course and Reassessment: Patient was initially evaluated by provider in triage and had labs, urine and stool studies performed as well as a CT abdomen and pelvis.  She had no active bleeding on exam and no obvious source of bleeding and is hemodynamically stable.  She will be given IV fluids for possible dehydration with her recent diarrhea and will be closely reassessed.  Independent labs interpretation:  The following labs were independently interpreted: mild leukocytosis and hypokalemia, otherwise within normal range  Independent visualization of imaging: - I independently visualized the following imaging with scope of interpretation limited to determining acute life threatening conditions related to emergency care: CTAP, which revealed colitis  Consultation: - Consulted or  discussed management/test interpretation w/ external professional: N/A  Consideration for admission or further workup: Patient has no emergent conditions requiring admission or further work-up at this time and is stable for discharge home with primary care and GI follow-up  Social Determinants of health: N/A    Risk Prescription drug management.          Final Clinical Impression(s) / ED Diagnoses Final diagnoses:  Colitis  Rectal bleeding    Rx / DC Orders ED Discharge Orders          Ordered    amoxicillin-clavulanate (AUGMENTIN) 875-125 MG tablet  Every 12 hours        07/15/22 2103              Kemper Durie, DO 07/15/22 2105

## 2022-07-15 NOTE — ED Provider Triage Note (Signed)
Emergency Medicine Provider Triage Evaluation Note  Susan Morrow , a 67 y.o. female  was evaluated in triage.  Pt complains of passing blood and mucus in stool.  Patient sent in by Dr. Collene Mares, her GI specialist.  She does not have a history of inflammatory bowel disease.  She states that yesterday she had 1 episode of diarrhea and then began passing blood and mucus and now every bowel movement she is having is just about blood and mucus.  She denies tenesmus or abdominal pain.  She spoke with Dr. Collene Mares today who said that she needed to come in despite the fact that she had "tickets to the women's [facility] basketball terminate today.".  Review of Systems  Positive: Bloody diarrhea Negative: vomiting  Physical Exam  There were no vitals taken for this visit. Gen:   Awake, no distress   Resp:  Normal effort  MSK:   Moves extremities without difficulty  Other:  TTP plower abdomen  Medical Decision Making  Medically screening exam initiated at 4:23 PM.  Appropriate orders placed.  Virl Son was informed that the remainder of the evaluation will be completed by another provider, this initial triage assessment does not replace that evaluation, and the importance of remaining in the ED until their evaluation is complete.     Margarita Mail, PA-C 07/15/22 1625

## 2022-07-21 ENCOUNTER — Other Ambulatory Visit: Payer: Self-pay | Admitting: Gastroenterology

## 2022-07-21 DIAGNOSIS — R9389 Abnormal findings on diagnostic imaging of other specified body structures: Secondary | ICD-10-CM

## 2022-07-22 ENCOUNTER — Ambulatory Visit
Admission: RE | Admit: 2022-07-22 | Discharge: 2022-07-22 | Disposition: A | Discharge status: Home / Self Care | Payer: HMO | Source: Ambulatory Visit | Attending: Gastroenterology | Admitting: Gastroenterology

## 2022-07-22 DIAGNOSIS — R9389 Abnormal findings on diagnostic imaging of other specified body structures: Secondary | ICD-10-CM

## 2022-07-22 MED ORDER — GADOPICLENOL 0.5 MMOL/ML IV SOLN
10.0000 mL | Freq: Once | INTRAVENOUS | Status: AC | PRN
  Administered 2022-07-22: 10 mL via INTRAVENOUS

## 2022-07-26 DIAGNOSIS — A09 Infectious gastroenteritis and colitis, unspecified: Secondary | ICD-10-CM | POA: Insufficient documentation

## 2022-09-27 DIAGNOSIS — M2569 Stiffness of other specified joint, not elsewhere classified: Secondary | ICD-10-CM | POA: Insufficient documentation

## 2022-11-24 DIAGNOSIS — K760 Fatty (change of) liver, not elsewhere classified: Secondary | ICD-10-CM | POA: Insufficient documentation

## 2022-11-24 DIAGNOSIS — Z800 Family history of malignant neoplasm of digestive organs: Secondary | ICD-10-CM | POA: Insufficient documentation

## 2022-11-24 DIAGNOSIS — K219 Gastro-esophageal reflux disease without esophagitis: Secondary | ICD-10-CM | POA: Insufficient documentation

## 2022-11-24 DIAGNOSIS — M1990 Unspecified osteoarthritis, unspecified site: Secondary | ICD-10-CM | POA: Insufficient documentation

## 2022-11-24 DIAGNOSIS — E785 Hyperlipidemia, unspecified: Secondary | ICD-10-CM | POA: Insufficient documentation

## 2022-11-24 DIAGNOSIS — R933 Abnormal findings on diagnostic imaging of other parts of digestive tract: Secondary | ICD-10-CM | POA: Insufficient documentation

## 2022-11-24 DIAGNOSIS — R251 Tremor, unspecified: Secondary | ICD-10-CM | POA: Insufficient documentation

## 2022-11-24 DIAGNOSIS — R141 Gas pain: Secondary | ICD-10-CM | POA: Insufficient documentation

## 2022-11-24 DIAGNOSIS — K589 Irritable bowel syndrome without diarrhea: Secondary | ICD-10-CM | POA: Insufficient documentation

## 2022-11-24 DIAGNOSIS — R1032 Left lower quadrant pain: Secondary | ICD-10-CM | POA: Insufficient documentation

## 2022-11-24 DIAGNOSIS — E669 Obesity, unspecified: Secondary | ICD-10-CM | POA: Insufficient documentation

## 2022-11-24 DIAGNOSIS — R42 Dizziness and giddiness: Secondary | ICD-10-CM | POA: Insufficient documentation

## 2022-11-24 DIAGNOSIS — K625 Hemorrhage of anus and rectum: Secondary | ICD-10-CM | POA: Insufficient documentation

## 2022-11-24 DIAGNOSIS — R152 Fecal urgency: Secondary | ICD-10-CM | POA: Insufficient documentation

## 2022-11-24 DIAGNOSIS — K649 Unspecified hemorrhoids: Secondary | ICD-10-CM | POA: Insufficient documentation

## 2022-12-19 ENCOUNTER — Institutional Professional Consult (permissible substitution): Payer: HMO | Admitting: Cardiothoracic Surgery

## 2022-12-19 ENCOUNTER — Encounter: Payer: Self-pay | Admitting: Cardiothoracic Surgery

## 2022-12-19 VITALS — BP 160/74 | HR 98 | Resp 20 | Ht 67.0 in | Wt 220.0 lb

## 2022-12-19 DIAGNOSIS — S2222XA Fracture of body of sternum, initial encounter for closed fracture: Secondary | ICD-10-CM
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
## 2023-01-23 ENCOUNTER — Ambulatory Visit (INDEPENDENT_AMBULATORY_CARE_PROVIDER_SITE_OTHER): Payer: HMO | Admitting: Cardiothoracic Surgery

## 2023-01-23 ENCOUNTER — Encounter: Payer: Self-pay | Admitting: Cardiothoracic Surgery

## 2023-01-23 ENCOUNTER — Other Ambulatory Visit: Payer: Self-pay | Admitting: Cardiothoracic Surgery

## 2023-01-23 VITALS — BP 175/84 | HR 84 | Resp 20 | Ht 67.0 in | Wt 229.0 lb

## 2023-01-23 DIAGNOSIS — S2222XA Fracture of body of sternum, initial encounter for closed fracture: Secondary | ICD-10-CM

## 2023-01-23 DIAGNOSIS — S2220XA Unspecified fracture of sternum, initial encounter for closed fracture: Secondary | ICD-10-CM | POA: Insufficient documentation
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
## 2023-01-23 NOTE — Progress Notes (Signed)
Patient

## 2023-02-23 ENCOUNTER — Other Ambulatory Visit: Payer: HMO

## 2023-02-27 ENCOUNTER — Ambulatory Visit: Payer: HMO | Admitting: Cardiothoracic Surgery

## 2023-03-08 ENCOUNTER — Ambulatory Visit
Admission: RE | Admit: 2023-03-08 | Discharge: 2023-03-08 | Disposition: A | Discharge status: Home / Self Care | Payer: HMO | Source: Ambulatory Visit | Attending: Cardiothoracic Surgery | Admitting: Cardiothoracic Surgery

## 2023-03-08 DIAGNOSIS — S2222XA Fracture of body of sternum, initial encounter for closed fracture: Secondary | ICD-10-CM

## 2023-03-13 ENCOUNTER — Ambulatory Visit (INDEPENDENT_AMBULATORY_CARE_PROVIDER_SITE_OTHER): Payer: HMO | Admitting: Cardiothoracic Surgery

## 2023-03-13 ENCOUNTER — Encounter: Payer: Self-pay | Admitting: Cardiothoracic Surgery

## 2023-03-13 VITALS — BP 160/85 | HR 90 | Resp 20 | Wt 223.0 lb

## 2023-03-13 DIAGNOSIS — S2222XA Fracture of body of sternum, initial encounter for closed fracture: Secondary | ICD-10-CM | POA: Diagnosis not present

## 2023-03-13 DIAGNOSIS — Z9289 Personal history of other medical treatment: Secondary | ICD-10-CM | POA: Insufficient documentation
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
